# Patient Record
Sex: Female | Born: 1965 | Race: White | Hispanic: No | Smoking: Never smoker
Health system: Southern US, Community
[De-identification: ages and names within clinical notes are randomized; demographics above are authoritative.]

## PROBLEM LIST (undated history)

## (undated) DIAGNOSIS — M1990 Unspecified osteoarthritis, unspecified site: Secondary | ICD-10-CM

## (undated) DIAGNOSIS — T7840XA Allergy, unspecified, initial encounter: Secondary | ICD-10-CM

## (undated) DIAGNOSIS — I10 Essential (primary) hypertension: Secondary | ICD-10-CM

## (undated) HISTORY — DX: Allergy, unspecified, initial encounter: T78.40XA

## (undated) HISTORY — DX: Unspecified osteoarthritis, unspecified site: M19.90

## (undated) HISTORY — DX: Essential (primary) hypertension: I10

---

## 2007-07-26 ENCOUNTER — Ambulatory Visit: Payer: Self-pay | Admitting: General Practice

## 2008-07-31 ENCOUNTER — Ambulatory Visit: Payer: Self-pay | Admitting: General Practice

## 2008-08-08 ENCOUNTER — Ambulatory Visit: Payer: Self-pay | Admitting: General Practice

## 2009-02-04 ENCOUNTER — Ambulatory Visit: Payer: Self-pay | Admitting: General Practice

## 2009-08-06 ENCOUNTER — Ambulatory Visit: Payer: Self-pay | Admitting: General Practice

## 2009-08-15 IMAGING — MG MM ADDITIONAL VIEWS AT NO CHARGE
2 series · 6 of 6 positions shown · non-contrast
Comparison: none

REASON FOR EXAM: Left breast density                           U/S, if
needed
COMMENTS:

PROCEDURE:     MAM - MAM DGTL ADD VW LT  SCR  - August 08, 2008  [DATE]
RESULT:     Additional views of the LEFT breast reveal no definite
underlying abnormality. To assure stability, follow-up LEFT breast mammogram
is suggested in six months.

[L ML · left · 3 of 3 slices shown]
[im 1/3]
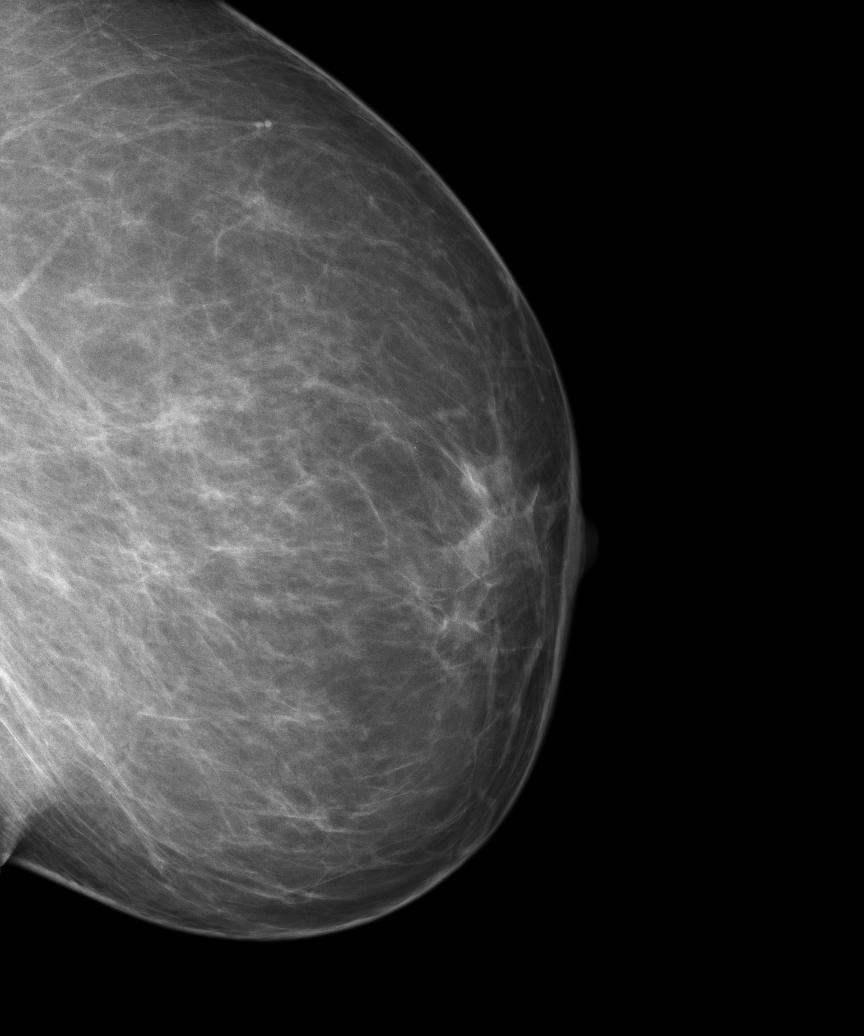
[im 2/3]
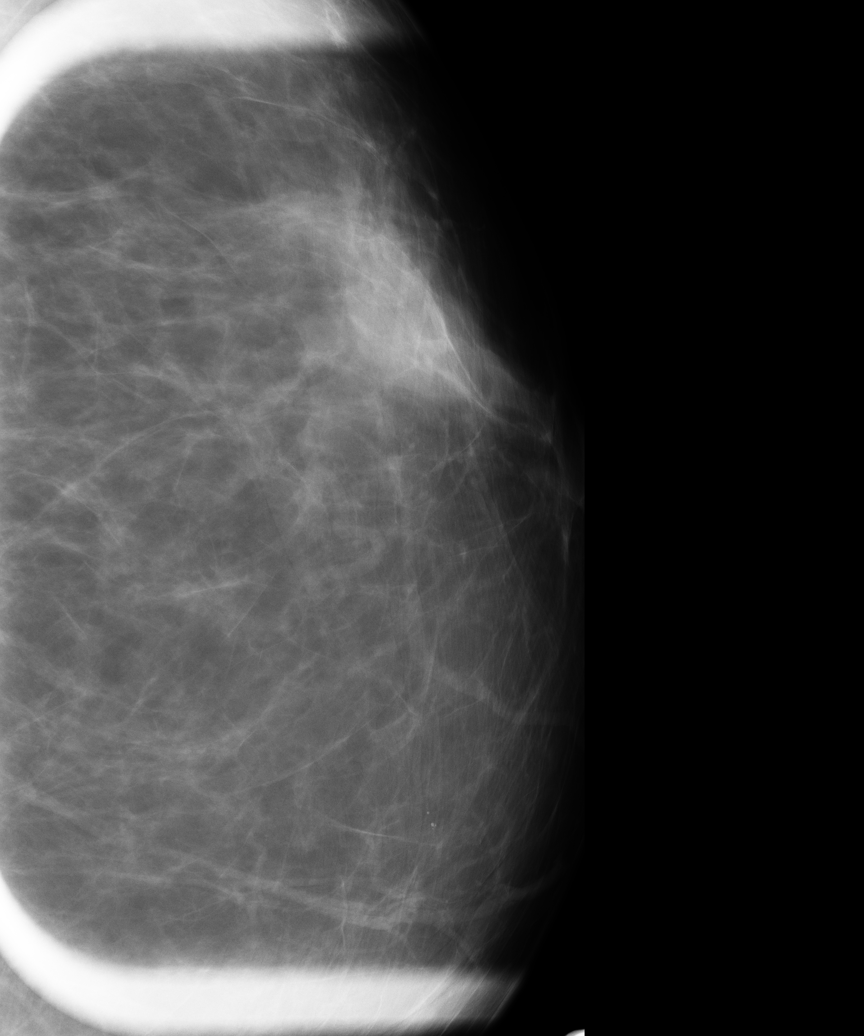
[im 3/3]
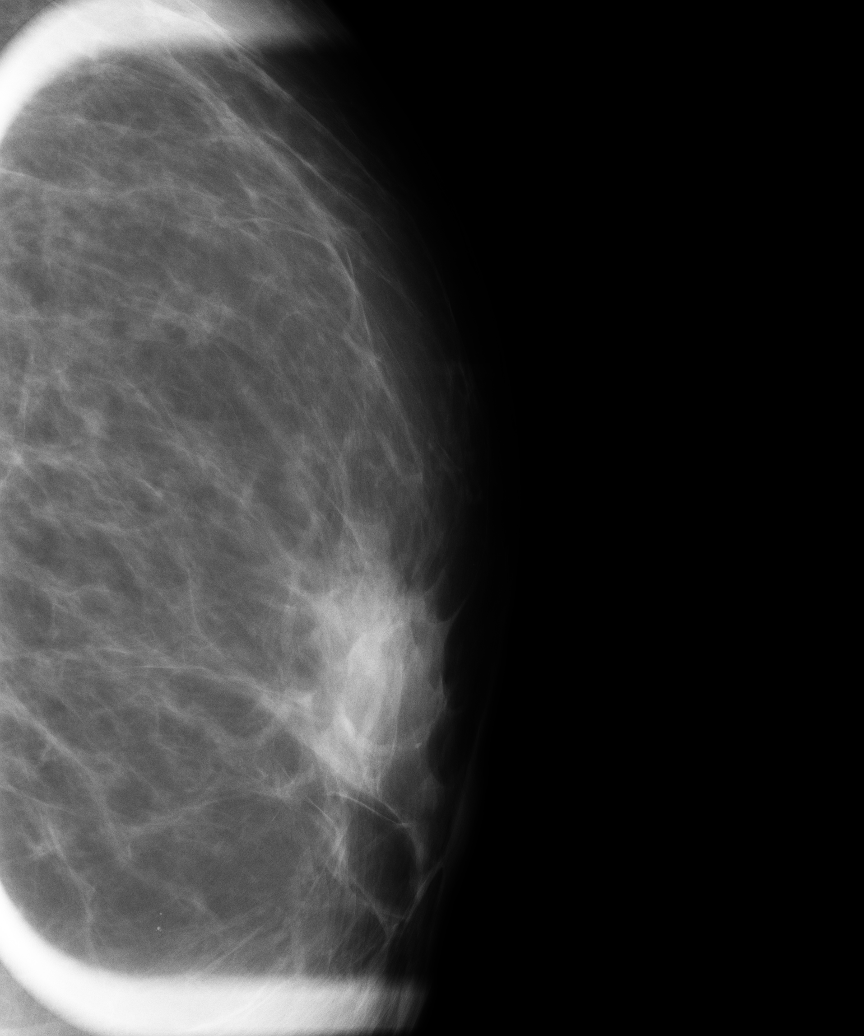

[L XCCL · left · 3 of 3 slices shown]
[im 1/3]
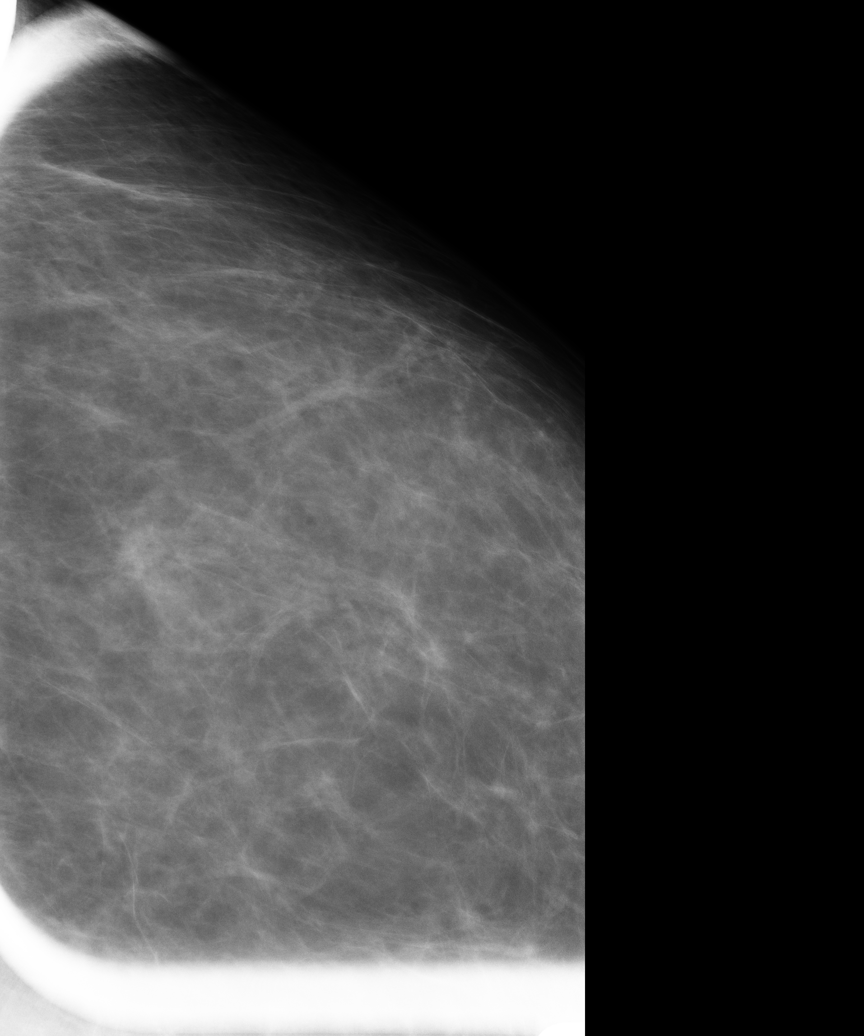
[im 2/3]
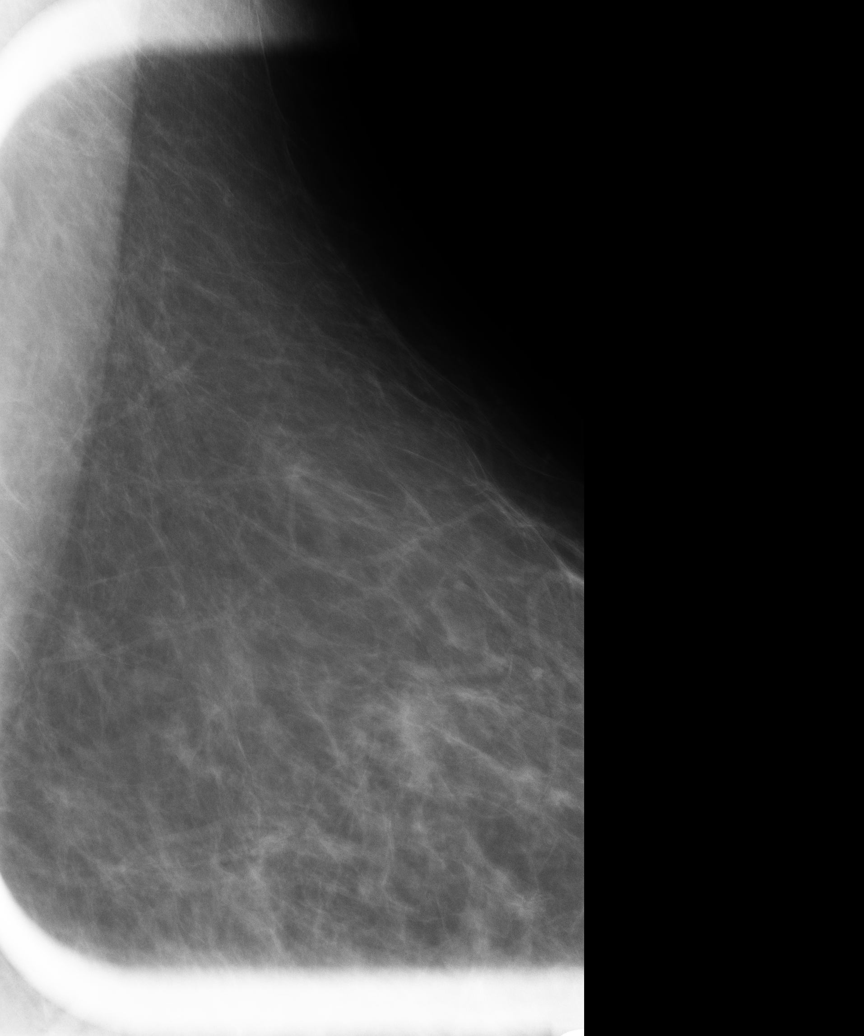
[im 3/3]
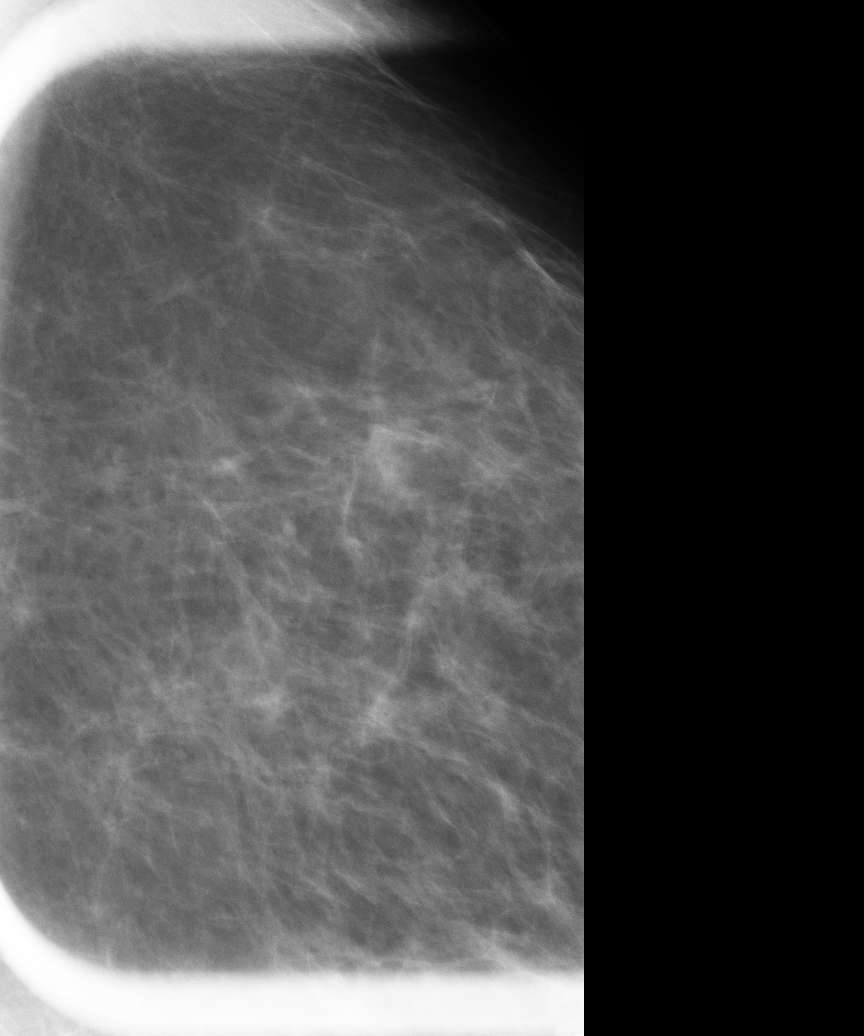

[6 of 6 positions shown; findings below may reference images not displayed]

IMPRESSION: No definite abnormality identified. Follow-up LEFT breast
mammogram is suggested in six months.

BI-RADS: Category 3 - Probably Benign Finding (interval follow-up).

Thank you for this opportunity to contribute to the care of your patient.

A NEGATIVE MAMMOGRAM REPORT DOES NOT PRECLUDE BIOPSY OR OTHER EVALUATION OF
A CLINICALLY PALPABLE OR OTHERWISE SUSPICIOUS MASS OR LESION. BREAST CANCER
MAY NOT BE DETECTED BY MAMMOGRAPHY IN UP TO 10% OF CASES.

## 2010-07-29 ENCOUNTER — Ambulatory Visit: Payer: Self-pay | Admitting: General Practice

## 2011-07-28 ENCOUNTER — Ambulatory Visit: Payer: Self-pay

## 2012-06-17 ENCOUNTER — Ambulatory Visit: Payer: Self-pay

## 2012-07-26 ENCOUNTER — Ambulatory Visit: Payer: Self-pay

## 2013-08-02 ENCOUNTER — Ambulatory Visit: Payer: Self-pay | Admitting: General Practice

## 2014-07-31 ENCOUNTER — Ambulatory Visit: Payer: Self-pay | Admitting: General Practice

## 2015-07-22 ENCOUNTER — Other Ambulatory Visit: Payer: Self-pay | Admitting: Emergency Medicine

## 2015-07-22 DIAGNOSIS — Z1231 Encounter for screening mammogram for malignant neoplasm of breast: Secondary | ICD-10-CM

## 2015-07-24 ENCOUNTER — Ambulatory Visit
Admission: RE | Admit: 2015-07-24 | Discharge: 2015-07-24 | Disposition: A | Discharge status: Home / Self Care | Payer: No Typology Code available for payment source | Source: Ambulatory Visit | Attending: Emergency Medicine | Admitting: Emergency Medicine

## 2015-07-24 DIAGNOSIS — Z1231 Encounter for screening mammogram for malignant neoplasm of breast: Secondary | ICD-10-CM | POA: Insufficient documentation

## 2015-07-30 ENCOUNTER — Other Ambulatory Visit: Payer: Self-pay | Admitting: Family Medicine

## 2015-07-31 LAB — TSH: TSH: 4.56 u[IU]/mL — AB (ref 0.450–4.500)

## 2016-07-13 ENCOUNTER — Other Ambulatory Visit: Payer: Self-pay | Admitting: Family Medicine

## 2016-07-14 LAB — CMP12+LP+TP+TSH+6AC+CBC/D/PLT
A/G RATIO: 1.7 (ref 1.2–2.2)
ALK PHOS: 89 IU/L (ref 39–117)
ALT: 19 IU/L (ref 0–32)
AST: 24 IU/L (ref 0–40)
Albumin: 4.5 g/dL (ref 3.5–5.5)
BUN / CREAT RATIO: 19 (ref 9–23)
BUN: 20 mg/dL (ref 6–24)
Basophils Absolute: 0 10*3/uL (ref 0.0–0.2)
Basos: 0 %
Bilirubin Total: 0.3 mg/dL (ref 0.0–1.2)
CHOL/HDL RATIO: 3.2 ratio (ref 0.0–4.4)
CREATININE: 1.05 mg/dL — AB (ref 0.57–1.00)
Calcium: 9.6 mg/dL (ref 8.7–10.2)
Chloride: 103 mmol/L (ref 96–106)
Cholesterol, Total: 196 mg/dL (ref 100–199)
EOS (ABSOLUTE): 0.1 10*3/uL (ref 0.0–0.4)
EOS: 2 %
Estimated CHD Risk: <0.5 times avg. (ref 0.0–1.0)
Free Thyroxine Index: 2 (ref 1.2–4.9)
GFR, EST AFRICAN AMERICAN: 71 mL/min/{1.73_m2} (ref >59)
GFR, EST NON AFRICAN AMERICAN: 61 mL/min/{1.73_m2} (ref >59)
GGT: 9 IU/L (ref 0–60)
GLUCOSE: 81 mg/dL (ref 65–99)
Globulin, Total: 2.7 g/dL (ref 1.5–4.5)
HDL: 62 mg/dL (ref >39)
HEMATOCRIT: 42.5 % (ref 34.0–46.6)
HEMOGLOBIN: 14.2 g/dL (ref 11.1–15.9)
IMMATURE GRANS (ABS): 0 10*3/uL (ref 0.0–0.1)
Immature Granulocytes: 0 %
Iron: 78 ug/dL (ref 27–159)
LDH: 189 IU/L (ref 119–226)
LDL CALC: 117 mg/dL — AB (ref 0–99)
LYMPHS: 26 %
Lymphocytes Absolute: 1.2 10*3/uL (ref 0.7–3.1)
MCH: 29.4 pg (ref 26.6–33.0)
MCHC: 33.4 g/dL (ref 31.5–35.7)
MCV: 88 fL (ref 79–97)
MONOCYTES: 9 %
Monocytes Absolute: 0.4 10*3/uL (ref 0.1–0.9)
NEUTROS ABS: 3.1 10*3/uL (ref 1.4–7.0)
Neutrophils: 63 %
PHOSPHORUS: 3.9 mg/dL (ref 2.5–4.5)
POTASSIUM: 4.4 mmol/L (ref 3.5–5.2)
Platelets: 207 10*3/uL (ref 150–379)
RBC: 4.83 x10E6/uL (ref 3.77–5.28)
RDW: 14.8 % (ref 12.3–15.4)
Sodium: 143 mmol/L (ref 134–144)
T3 Uptake Ratio: 25 % (ref 24–39)
T4, Total: 7.9 ug/dL (ref 4.5–12.0)
TSH: 4.87 u[IU]/mL — ABNORMAL HIGH (ref 0.450–4.500)
Total Protein: 7.2 g/dL (ref 6.0–8.5)
Triglycerides: 83 mg/dL (ref 0–149)
URIC ACID: 5.7 mg/dL (ref 2.5–7.1)
VLDL CHOLESTEROL CAL: 17 mg/dL (ref 5–40)
WBC: 4.8 10*3/uL (ref 3.4–10.8)

## 2016-07-14 LAB — HGB A1C W/O EAG: HEMOGLOBIN A1C: 5.3 % (ref 4.8–5.6)

## 2016-07-15 ENCOUNTER — Encounter: Payer: Self-pay | Admitting: Internal Medicine

## 2016-07-15 ENCOUNTER — Ambulatory Visit (INDEPENDENT_AMBULATORY_CARE_PROVIDER_SITE_OTHER): Payer: No Typology Code available for payment source | Admitting: Internal Medicine

## 2016-07-15 ENCOUNTER — Other Ambulatory Visit: Payer: Self-pay | Admitting: Internal Medicine

## 2016-07-15 VITALS — BP 116/80 | HR 70 | Temp 98.0°F | Ht 63.75 in | Wt 207.8 lb

## 2016-07-15 DIAGNOSIS — Z1159 Encounter for screening for other viral diseases: Secondary | ICD-10-CM | POA: Diagnosis not present

## 2016-07-15 DIAGNOSIS — Z1231 Encounter for screening mammogram for malignant neoplasm of breast: Secondary | ICD-10-CM

## 2016-07-15 DIAGNOSIS — Z114 Encounter for screening for human immunodeficiency virus [HIV]: Secondary | ICD-10-CM | POA: Diagnosis not present

## 2016-07-15 DIAGNOSIS — Z0000 Encounter for general adult medical examination without abnormal findings: Secondary | ICD-10-CM

## 2016-07-15 DIAGNOSIS — M79671 Pain in right foot: Secondary | ICD-10-CM | POA: Diagnosis not present

## 2016-07-15 NOTE — Progress Notes (Signed)
HPI  Pt presents to th clinic today to establish care. She has not had a PCP in many years. She would like to get her annual exam today if she could.  Flu: never Tetanus: > 10 years ago Pap Smear: never Mammogram: 07/2015, scheduled 07/27/16 Colon Screening: never Vision Screening: as needed Dentist: biannually  Diet: She does eat meat. She consumes fruits and veggies daily. She does eat some fried food. She drinks water and coffee. Exercise: None  Past Medical History:  Diagnosis Date  . Allergy   . Arthritis     No current outpatient prescriptions on file.   No current facility-administered medications for this visit.     Allergies  Allergen Reactions  . Sulfa Antibiotics     Pt does not remember    Family History  Problem Relation Age of Onset  . Breast cancer Paternal Aunt   . Hypertension Mother   . Stroke Mother     Social History   Social History  . Marital status: Divorced    Spouse name: N/A  . Number of children: N/A  . Years of education: N/A   Occupational History  . Not on file.   Social History Main Topics  . Smoking status: Never Smoker  . Smokeless tobacco: Never Used  . Alcohol use Yes     Comment: rare  . Drug use: No  . Sexual activity: Not on file   Other Topics Concern  . Not on file   Social History Narrative  . No narrative on file    ROS:  Constitutional: Denies fever, malaise, fatigue, headache or abrupt weight changes.  HEENT: Denies eye pain, eye redness, ear pain, ringing in the ears, wax buildup, runny nose, nasal congestion, bloody nose, or sore throat. Respiratory: Denies difficulty breathing, shortness of breath, cough or sputum production.   Cardiovascular: Denies chest pain, chest tightness, palpitations or swelling in the hands or feet.  Gastrointestinal: Denies abdominal pain, bloating, constipation, diarrhea or blood in the stool.  GU: Denies frequency, urgency, pain with urination, blood in urine, odor or  discharge. Musculoskeletal: Pt reports right heel pain. Denies decrease in range of motion, difficulty with gait, muscle pain or joint swelling.  Skin: Denies redness, rashes, lesions or ulcercations.  Neurological: Denies dizziness, difficulty with memory, difficulty with speech or problems with balance and coordination.  Psych: Denies anxiety, depression, SI/HI.  No other specific complaints in a complete review of systems (except as listed in HPI above).  PE:  BP 116/80   Pulse 70   Temp 98 F (36.7 C) (Oral)   Ht 5' 3.75" (1.619 m)   Wt 207 lb 12 oz (94.2 kg)   LMP  (LMP Unknown) Comment: postmeopausal  SpO2 98%   BMI 35.94 kg/m  Wt Readings from Last 3 Encounters:  07/15/16 207 lb 12 oz (94.2 kg)    General: Appears her stated age, obese in NAD. HEENT: Head: normal shape and size; Eyes: sclera white, no icterus, conjunctiva pink, PERRLA and EOMs intact; Ears: Tm's gray and intact, normal light reflex;Throat/Mouth: Teeth present, mucosa pink and moist, no lesions or ulcerations noted.  Neck: Neck supple, trachea midline. No masses, lumps or thyromegaly present.  Cardiovascular: Normal rate and rhythm. S1,S2 noted.  No murmur, rubs or gallops noted. No JVD or BLE edema. No carotid bruits noted. Pulmonary/Chest: Normal effort and positive vesicular breath sounds. No respiratory distress. No wheezes, rales or ronchi noted.  Abdomen: Soft and nontender. Normal bowel sounds. No distention  or masses noted. Liver, spleen and kidneys non palpable. Musculoskeletal: Normal dorsi flexion and plantar flexion of right foot. Pain with palpation over the heel. No difficulty with gait.  Neurological: Alert and oriented. Cranial nerves II-XII grossly intact. Coordination normal.  Psychiatric: Mood and affect normal. Behavior is normal. Judgment and thought content normal.    Assessment and Plan:  Preventative Health Maintenance:  She declines flu or tetanus today She declines pap smear  today, but reports she will make an appt to reschedule Mammogram scheduled She declines colonoscopy or Cologuard Encouraged her to see an eye doctor and dentist annually Encouraged her to consume a balanced diet and exercise regimen Will check CBC, CMET, Lipid,TSH, A1C, HIV and Hep C today  Right heel pain:  She reports she has been told she has a bone spur in the past Likely plantar fasciitis Advised her to take Aleve once daily If persist, can refer to podiatry  RTC in 1 year, sooner if you want to have your pap smear done sooner Nicki ReaperBAITY, REGINA, NP

## 2016-07-15 NOTE — Patient Instructions (Signed)

## 2016-07-16 LAB — COMPREHENSIVE METABOLIC PANEL
ALT: 20 U/L (ref 0–35)
AST: 20 U/L (ref 0–37)
Albumin: 3.8 g/dL (ref 3.5–5.2)
Alkaline Phosphatase: 74 U/L (ref 39–117)
BILIRUBIN TOTAL: 0.5 mg/dL (ref 0.2–1.2)
BUN: 19 mg/dL (ref 6–23)
CHLORIDE: 105 meq/L (ref 96–112)
CO2: 28 meq/L (ref 19–32)
Calcium: 9.7 mg/dL (ref 8.4–10.5)
Creatinine, Ser: 0.92 mg/dL (ref 0.40–1.20)
GFR: 68.13 mL/min (ref >60.00)
GLUCOSE: 79 mg/dL (ref 70–99)
POTASSIUM: 4.5 meq/L (ref 3.5–5.1)
Sodium: 139 mEq/L (ref 135–145)
Total Protein: 7.2 g/dL (ref 6.0–8.3)

## 2016-07-16 LAB — CBC
HCT: 41.5 % (ref 36.0–46.0)
HEMOGLOBIN: 14 g/dL (ref 12.0–15.0)
MCHC: 33.7 g/dL (ref 30.0–36.0)
MCV: 87.4 fl (ref 78.0–100.0)
Platelets: 192 10*3/uL (ref 150.0–400.0)
RBC: 4.75 Mil/uL (ref 3.87–5.11)
RDW: 14.2 % (ref 11.5–15.5)
WBC: 6 10*3/uL (ref 4.0–10.5)

## 2016-07-16 LAB — LIPID PANEL
CHOL/HDL RATIO: 3
Cholesterol: 179 mg/dL (ref 0–200)
HDL: 60.4 mg/dL (ref >39.00)
LDL CALC: 101 mg/dL — AB (ref 0–99)
NonHDL: 118.82
TRIGLYCERIDES: 87 mg/dL (ref 0.0–149.0)
VLDL: 17.4 mg/dL (ref 0.0–40.0)

## 2016-07-16 LAB — HEPATITIS C ANTIBODY: HCV AB: NEGATIVE

## 2016-07-16 LAB — HEMOGLOBIN A1C: Hgb A1c MFr Bld: 5.4 % (ref 4.6–6.5)

## 2016-07-16 LAB — TSH: TSH: 4.08 u[IU]/mL (ref 0.35–4.50)

## 2016-07-16 LAB — HIV ANTIBODY (ROUTINE TESTING W REFLEX): HIV 1&2 Ab, 4th Generation: NONREACTIVE

## 2016-07-27 ENCOUNTER — Encounter: Payer: Self-pay | Admitting: Radiology

## 2016-07-27 ENCOUNTER — Ambulatory Visit
Admission: RE | Admit: 2016-07-27 | Discharge: 2016-07-27 | Disposition: A | Discharge status: Home / Self Care | Payer: No Typology Code available for payment source | Source: Ambulatory Visit | Attending: Internal Medicine | Admitting: Internal Medicine

## 2016-07-27 DIAGNOSIS — Z1231 Encounter for screening mammogram for malignant neoplasm of breast: Secondary | ICD-10-CM | POA: Diagnosis present

## 2017-07-06 ENCOUNTER — Ambulatory Visit: Payer: Self-pay | Admitting: *Deleted

## 2017-07-06 VITALS — BP 161/105 | HR 80 | Ht 64.0 in | Wt 219.0 lb

## 2017-07-06 DIAGNOSIS — Z0000 Encounter for general adult medical examination without abnormal findings: Secondary | ICD-10-CM

## 2017-07-06 NOTE — Progress Notes (Signed)
[employer] insurance premium discount evaluation: Labs Drawn. Replacements ROI form signed. Tobacco Free Attestation form signed.  Forms placed in paper chart.  Plan to recheck BP at review.  Okay to route results to pcp per pt.

## 2017-07-07 LAB — CMP12+LP+TP+TSH+6AC+CBC/D/PLT
ALT: 17 IU/L (ref 0–32)
AST: 21 IU/L (ref 0–40)
Albumin/Globulin Ratio: 1.7 (ref 1.2–2.2)
Albumin: 4.3 g/dL (ref 3.5–5.5)
Alkaline Phosphatase: 95 IU/L (ref 39–117)
BASOS ABS: 0 10*3/uL (ref 0.0–0.2)
BUN/Creatinine Ratio: 23 (ref 9–23)
BUN: 22 mg/dL (ref 6–24)
Basos: 1 %
Bilirubin Total: 0.3 mg/dL (ref 0.0–1.2)
CALCIUM: 9.3 mg/dL (ref 8.7–10.2)
CHOL/HDL RATIO: 3 ratio (ref 0.0–4.4)
CREATININE: 0.97 mg/dL (ref 0.57–1.00)
Chloride: 104 mmol/L (ref 96–106)
Cholesterol, Total: 187 mg/dL (ref 100–199)
EOS (ABSOLUTE): 0.1 10*3/uL (ref 0.0–0.4)
Eos: 1 %
Estimated CHD Risk: <0.5 times avg. (ref 0.0–1.0)
Free Thyroxine Index: 1.9 (ref 1.2–4.9)
GFR calc Af Amer: 78 mL/min/{1.73_m2} (ref >59)
GFR, EST NON AFRICAN AMERICAN: 67 mL/min/{1.73_m2} (ref >59)
GGT: 10 IU/L (ref 0–60)
Globulin, Total: 2.6 g/dL (ref 1.5–4.5)
Glucose: 70 mg/dL (ref 65–99)
HDL: 62 mg/dL (ref >39)
Hematocrit: 42.5 % (ref 34.0–46.6)
Hemoglobin: 14 g/dL (ref 11.1–15.9)
IRON: 86 ug/dL (ref 27–159)
Immature Grans (Abs): 0 10*3/uL (ref 0.0–0.1)
Immature Granulocytes: 0 %
LDH: 199 IU/L (ref 119–226)
LDL Calculated: 111 mg/dL — ABNORMAL HIGH (ref 0–99)
Lymphocytes Absolute: 1.1 10*3/uL (ref 0.7–3.1)
Lymphs: 28 %
MCH: 29.4 pg (ref 26.6–33.0)
MCHC: 32.9 g/dL (ref 31.5–35.7)
MCV: 89 fL (ref 79–97)
MONOS ABS: 0.4 10*3/uL (ref 0.1–0.9)
Monocytes: 10 %
Neutrophils Absolute: 2.4 10*3/uL (ref 1.4–7.0)
Neutrophils: 60 %
PHOSPHORUS: 3.1 mg/dL (ref 2.5–4.5)
PLATELETS: 198 10*3/uL (ref 150–379)
POTASSIUM: 4.8 mmol/L (ref 3.5–5.2)
RBC: 4.76 x10E6/uL (ref 3.77–5.28)
RDW: 14.2 % (ref 12.3–15.4)
Sodium: 140 mmol/L (ref 134–144)
T3 UPTAKE RATIO: 25 % (ref 24–39)
T4 TOTAL: 7.7 ug/dL (ref 4.5–12.0)
TOTAL PROTEIN: 6.9 g/dL (ref 6.0–8.5)
TRIGLYCERIDES: 72 mg/dL (ref 0–149)
TSH: 4.22 u[IU]/mL (ref 0.450–4.500)
Uric Acid: 5.4 mg/dL (ref 2.5–7.1)
VLDL Cholesterol Cal: 14 mg/dL (ref 5–40)
WBC: 4 10*3/uL (ref 3.4–10.8)

## 2017-07-07 LAB — HGB A1C W/O EAG: HEMOGLOBIN A1C: 5.3 % (ref 4.8–5.6)

## 2017-07-12 ENCOUNTER — Encounter: Payer: Self-pay | Admitting: Internal Medicine

## 2017-07-12 NOTE — Progress Notes (Signed)
Results reviewed with pt. LDL elevated, slightly worsened from previous. All other labs unremarkable. BP rechecked. 164/105 today. Has f/u scheduled with pcp on 10/16. Asymptomatic with HTN. Diet and exercise recommendations for LDL, wt management (BMI 37) discussed. Routine f/u with pcp. Copy of labs provided to pt. Results routed to pcp per pt request. No further questions concerns.

## 2017-07-20 ENCOUNTER — Encounter: Payer: Self-pay | Admitting: Internal Medicine

## 2017-07-20 ENCOUNTER — Other Ambulatory Visit (HOSPITAL_COMMUNITY)
Admission: RE | Admit: 2017-07-20 | Discharge: 2017-07-20 | Disposition: A | Discharge status: Home / Self Care | Payer: No Typology Code available for payment source | Source: Ambulatory Visit | Attending: Internal Medicine | Admitting: Internal Medicine

## 2017-07-20 ENCOUNTER — Ambulatory Visit (INDEPENDENT_AMBULATORY_CARE_PROVIDER_SITE_OTHER): Payer: No Typology Code available for payment source | Admitting: Internal Medicine

## 2017-07-20 VITALS — BP 122/80 | HR 63 | Temp 97.8°F | Wt 219.0 lb

## 2017-07-20 DIAGNOSIS — Z01419 Encounter for gynecological examination (general) (routine) without abnormal findings: Secondary | ICD-10-CM | POA: Diagnosis not present

## 2017-07-20 DIAGNOSIS — Z23 Encounter for immunization: Secondary | ICD-10-CM | POA: Diagnosis not present

## 2017-07-20 DIAGNOSIS — R030 Elevated blood-pressure reading, without diagnosis of hypertension: Secondary | ICD-10-CM

## 2017-07-20 DIAGNOSIS — Z124 Encounter for screening for malignant neoplasm of cervix: Secondary | ICD-10-CM | POA: Diagnosis not present

## 2017-07-20 NOTE — Addendum Note (Signed)
Addended by: Roena Malady on: 07/20/2017 02:49 PM   Modules accepted: Orders

## 2017-07-20 NOTE — Patient Instructions (Signed)

## 2017-07-20 NOTE — Progress Notes (Signed)
Subjective:    Patient ID: Ellen Fischer, female    DOB: 24-May-1966, 51 y.o.   MRN: [id number]  HPI  Pt presents to the clinic today with c/o elevated blood pressure. 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 Her BP at that visit was 161/105. She has never been told that she has high blood pressure in the past. She denies headaches, visual changes, dizziness or chest pain. Her BP today is 122/80.   Flu: never Tetanus:  >10 years ago Pap Smear: > 5 years ago Mammogram: 07/2016 Colon Screening: never Vision Screening: as needed Dentist: biannually  Review of Systems  Past Medical History:  Diagnosis Date  . Allergy   . Arthritis     No current outpatient prescriptions on file.   No current facility-administered medications for this visit.     Allergies  Allergen Reactions  . Sulfa Antibiotics     Pt does not remember    Family History  Problem Relation Age of Onset  . Breast cancer Paternal Aunt   . Hypertension Mother   . Stroke Mother     Social History   Social History  . Marital status: Divorced    Spouse name: N/A  . Number of children: N/A  . Years of education: N/A   Occupational History  . Not on file.   Social History Main Topics  . Smoking status: Never Smoker  . Smokeless tobacco: Never Used  . Alcohol use Yes     Comment: rare  . Drug use: No  . Sexual activity: No   Other Topics Concern  . Not on file   Social History Narrative  . No narrative on file     Constitutional: Denies fever, malaise, fatigue, headache or abrupt weight changes.  Respiratory: Denies difficulty breathing, shortness of breath, cough or sputum production.   Cardiovascular: Denies chest pain, chest tightness, palpitations or swelling in the hands or feet.  Gastrointestinal: Denies abdominal pain, bloating, constipation, diarrhea or blood in the stool.  GU: Denies urgency, frequency, pain with urination, burning sensation, blood  in urine, odor or discharge. Neurological: Denies dizziness, difficulty with memory, difficulty with speech or problems with balance and coordination.    No other specific complaints in a complete review of systems (except as listed in HPI above).     Objective:   Physical Exam  BP 122/80   Pulse 63   Temp 97.8 F (36.6 C) (Oral)   Wt 219 lb (99.3 kg)   LMP  (LMP Unknown) Comment: postmeopausal  SpO2 98%   BMI 37.59 kg/m  Wt Readings from Last 3 Encounters:  07/20/17 219 lb (99.3 kg)  07/06/17 219 lb (99.3 kg)  07/15/16 207 lb 12 oz (94.2 kg)    General: Appears her stated age, obese in NAD. SCardiovascular: Normal rate and rhythm. S1,S2 noted.  No murmur, rubs or gallops noted.  Pulmonary/Chest: Normal effort and positive vesicular breath sounds. No respiratory distress. No wheezes, rales or ronchi noted.  Abdomen: Soft and nontender. Normal bowel sounds. No distention or masses noted. Pelvic: Normal female anatomy. Cervix without changes. No discharge noted. Adnexa non palpable. Neurological: Alert and oriented.   BMET    Component Value Date/Time   NA 140 07/06/2017 0922   K 4.8 07/06/2017 0922   CL 104 07/06/2017 0922   CO2 28 07/15/2016 1552   GLUCOSE 70 07/06/2017 0922   GLUCOSE 79 07/15/2016 1552   BUN 22 07/06/2017 4098  CREATININE 0.97 07/06/2017 0922   CALCIUM 9.3 07/06/2017 0922   GFRNONAA 67 07/06/2017 0922   GFRAA 78 07/06/2017 0922    Lipid Panel     Component Value Date/Time   CHOL 187 07/06/2017 0922   TRIG 72 07/06/2017 0922   HDL 62 07/06/2017 0922   CHOLHDL 3.0 07/06/2017 0922   CHOLHDL 3 07/15/2016 1552   VLDL 17.4 07/15/2016 1552   LDLCALC 111 (H) 07/06/2017 0922    CBC    Component Value Date/Time   WBC 4.0 07/06/2017 0922   WBC 6.0 07/15/2016 1552   RBC 4.76 07/06/2017 0922   RBC 4.75 07/15/2016 1552   HGB 14.0 07/06/2017 0922   HCT 42.5 07/06/2017 0922   PLT 198 07/06/2017 0922   MCV 89 07/06/2017 0922   MCH 29.4  07/06/2017 0922   MCHC 32.9 07/06/2017 0922   MCHC 33.7 07/15/2016 1552   RDW 14.2 07/06/2017 0922   LYMPHSABS 1.1 07/06/2017 0922   EOSABS 0.1 07/06/2017 0922   BASOSABS 0.0 07/06/2017 0922    Hgb A1C Lab Results  Component Value Date   HGBA1C 5.3 07/06/2017            Assessment & Plan:   Screening for Cervical Cancer with Routine GYN Exam:  Pap smear obtained today, will call you with the results  Need for Tetanus:  Tetanus booster today  Elevated Blood Pressure Reading without diagnosis of HTN:  BP is fine No need for medication therapy Will monitor  RTC in 1 year for your annual exam Nicki Reaper, NP

## 2017-07-22 LAB — CYTOLOGY - PAP
DIAGNOSIS: NEGATIVE
HPV: NOT DETECTED

## 2017-08-02 ENCOUNTER — Other Ambulatory Visit: Payer: Self-pay | Admitting: Internal Medicine

## 2017-08-02 DIAGNOSIS — Z1231 Encounter for screening mammogram for malignant neoplasm of breast: Secondary | ICD-10-CM

## 2017-08-05 ENCOUNTER — Ambulatory Visit
Admission: RE | Admit: 2017-08-05 | Discharge: 2017-08-05 | Disposition: A | Discharge status: Home / Self Care | Payer: No Typology Code available for payment source | Source: Ambulatory Visit | Attending: Internal Medicine | Admitting: Internal Medicine

## 2017-08-05 DIAGNOSIS — Z1231 Encounter for screening mammogram for malignant neoplasm of breast: Secondary | ICD-10-CM | POA: Insufficient documentation

## 2018-03-01 ENCOUNTER — Ambulatory Visit: Payer: Self-pay | Admitting: Registered Nurse

## 2018-03-01 ENCOUNTER — Encounter: Payer: Self-pay | Admitting: Registered Nurse

## 2018-03-01 VITALS — BP 130/101 | HR 94 | Temp 99.8°F | Resp 16

## 2018-03-01 DIAGNOSIS — H65192 Other acute nonsuppurative otitis media, left ear: Secondary | ICD-10-CM

## 2018-03-01 DIAGNOSIS — J029 Acute pharyngitis, unspecified: Secondary | ICD-10-CM

## 2018-03-01 DIAGNOSIS — J019 Acute sinusitis, unspecified: Secondary | ICD-10-CM

## 2018-03-01 MED ORDER — SALINE SPRAY 0.65 % NA SOLN: 2.0000 | NASAL | 0 refills | Status: DC

## 2018-03-01 MED ORDER — FLUTICASONE PROPIONATE 50 MCG/ACT NA SUSP: 1.0000 | Freq: Every day | NASAL | 1 refills | Status: DC

## 2018-03-01 MED ORDER — BENZONATATE 200 MG PO CAPS: 200.0000 mg | ORAL_CAPSULE | Freq: Three times a day (TID) | ORAL | 0 refills | Status: AC | PRN

## 2018-03-01 MED ORDER — ACETAMINOPHEN 500 MG PO TABS: 1000.0000 mg | ORAL_TABLET | Freq: Four times a day (QID) | ORAL | 0 refills | Status: AC | PRN

## 2018-03-01 MED ORDER — AMOXICILLIN 875 MG PO TABS: 875.0000 mg | ORAL_TABLET | Freq: Two times a day (BID) | ORAL | 0 refills | Status: AC

## 2018-03-01 NOTE — Progress Notes (Signed)
Subjective:    Patient ID: Ellen Fischer, female    DOB: 11-Feb-1966, 52 y.o.   MRN: [id number]  52 y/o Caucasian female pt c/o productive cough, sore throat x1 wk, worse in past 4 days. Chest very sore from the cough. C/o pain below bil ears down neck, possible eustachian tube discomfort. States she feels like her tongue and throat are swollen from coughing so much. Pt in NAD, 97% O2 sat, slightly hoarse voice, speaking complete sentences with occasional interruption by need to cough. Denies sinus pain/pressure, HA, known fever at home, rhinorrhea. Has been taking Alka Seltzer Plus Severe Cold & Cough with some relief at home. Last dose 9pm last night. No other meds, Rx . Has also tried dayquil and nyquil that worked a little but didn't resolve symptoms.  Denied sick contacts at home.  +sick contacts at work.  Typically seasonal allergies worst in the fall  Has noticed some allergy symptoms this spring.       Review of Systems  Constitutional: Positive for fever. Negative for activity change, appetite change, chills, diaphoresis and fatigue.  HENT: Positive for congestion, mouth sores, postnasal drip, rhinorrhea, sore throat and voice change. Negative for dental problem, drooling, ear discharge, ear pain, facial swelling, hearing loss, nosebleeds, sinus pressure, sinus pain, sneezing, tinnitus and trouble swallowing.   Eyes: Negative for photophobia and visual disturbance.  Respiratory: Positive for cough and wheezing. Negative for choking, chest tightness, shortness of breath and stridor.   Cardiovascular: Negative for palpitations.  Gastrointestinal: Negative for abdominal distention, abdominal pain, diarrhea, nausea and vomiting.  Endocrine: Negative for cold intolerance and heat intolerance.  Genitourinary: Negative for difficulty urinating.  Musculoskeletal: Negative for arthralgias, back pain, gait problem, joint swelling, myalgias, neck pain and neck stiffness.  Skin: Negative  for color change, pallor and rash.  Allergic/Immunologic: Positive for environmental allergies. Negative for food allergies.  Neurological: Negative for dizziness, tremors, seizures, syncope, facial asymmetry, speech difficulty, weakness, light-headedness, numbness and headaches.  Hematological: Negative for adenopathy. Does not bruise/bleed easily.  Psychiatric/Behavioral: Negative for agitation, confusion and sleep disturbance.       Objective:   Physical Exam  hysical Exam  Constitutional: She is oriented to person, place, and time. She appears well-developed and well-nourished. She is active and cooperative.  Non-toxic appearance. She does not have a sickly appearance. She appears ill. No distress.  HENT:  Head: Normocephalic and atraumatic.  Right Ear: Hearing, external ear and ear canal normal. No drainage, swelling or tenderness. A middle ear effusion is present. No decreased hearing is noted.  Left Ear: Hearing, external ear and ear canal normal. No drainage, swelling or tenderness. Tympanic membrane is erythematous and bulging. A middle ear effusion is present. No decreased hearing is noted.  Nose: Mucosal edema and rhinorrhea present. No nose lacerations, sinus tenderness, nasal deformity, septal deviation or nasal septal hematoma. No epistaxis.  No foreign bodies. Right sinus exhibits no maxillary sinus tenderness and no frontal sinus tenderness. Left sinus exhibits no maxillary sinus tenderness and no frontal sinus tenderness.  Mouth/Throat: Uvula is midline and mucous membranes are normal. Mucous membranes are not pale, not dry and not cyanotic. She does not have dentures. Oral lesions present. No trismus in the jaw. Normal dentition. No dental abscesses, uvula swelling, lacerations or dental caries. Posterior oropharyngeal edema and posterior oropharyngeal erythema present. No oropharyngeal exudate or tonsillar abscesses. Tonsils are 2+ on the right. Tonsils are 2+ on the left. No  tonsillar exudate.  Bilateral tonsils edema  erythema macular 2+/4; cobblestoning posterior pharynx; oropharynx macular papular rash erythema; bilateral allergic shiners; bilateral nasal turbinates edema erythema clear discharge; left TM central erythema air fluid level clear; right TM air fluid level clear  Eyes: Pupils are equal, round, and reactive to light. Conjunctivae, EOM and lids are normal. Right eye exhibits no chemosis, no discharge, no exudate and no hordeolum. No foreign body present in the right eye. Left eye exhibits no chemosis, no discharge, no exudate and no hordeolum. No foreign body present in the left eye. Right conjunctiva is not injected. Right conjunctiva has no hemorrhage. Left conjunctiva is not injected. Left conjunctiva has no hemorrhage. No scleral icterus. Right eye exhibits normal extraocular motion and no nystagmus. Left eye exhibits normal extraocular motion and no nystagmus. Right pupil is round and reactive. Left pupil is round and reactive. Pupils are equal.  Neck: Trachea normal, normal range of motion and phonation normal. Neck supple. No tracheal tenderness and no muscular tenderness present. No neck rigidity. No tracheal deviation, no edema, no erythema and normal range of motion present. No thyroid mass and no thyromegaly present.  Cardiovascular: Normal rate, regular rhythm, S1 normal, S2 normal, normal heart sounds and intact distal pulses. PMI is not displaced. Exam reveals no gallop, no distant heart sounds and no friction rub.  No murmur heard. Pulmonary/Chest: Effort normal and breath sounds normal. No accessory muscle usage or stridor. No respiratory distress. She has no decreased breath sounds. She has no wheezes. She has no rhonchi. She has no rales. She exhibits no tenderness.  Hoarse voice; spoke full sentences without difficulty; intermittent dry weak cough  Abdominal: Soft. Normal appearance. She exhibits no distension and no fluid wave. There is no  rigidity and no guarding.  Musculoskeletal: Normal range of motion. She exhibits no edema or tenderness.       Right shoulder: Normal.       Left shoulder: Normal.       Right elbow: Normal.      Left elbow: Normal.       Right hip: Normal.       Left hip: Normal.       Right knee: Normal.       Left knee: Normal.       Cervical back: Normal.       Thoracic back: Normal.       Lumbar back: Normal.       Right hand: Normal.       Left hand: Normal.  Lymphadenopathy:       Head (right side): No submental, no submandibular, no tonsillar, no preauricular, no posterior auricular and no occipital adenopathy present.       Head (left side): No submental, no submandibular, no tonsillar, no preauricular, no posterior auricular and no occipital adenopathy present.    She has no cervical adenopathy.       Right cervical: No superficial cervical, no deep cervical and no posterior cervical adenopathy present.      Left cervical: No superficial cervical, no deep cervical and no posterior cervical adenopathy present.  Anterior cervical proximal lymph nodes ttp but no nodules/enlargement palpated  Neurological: She is alert and oriented to person, place, and time. She has normal strength. She is not disoriented. She displays no atrophy and no tremor. No cranial nerve deficit or sensory deficit. She exhibits normal muscle tone. She displays no seizure activity. Coordination and gait normal. GCS eye subscore is 4. GCS verbal subscore is 5. GCS motor subscore is 6.  On/off exam table; in/out of chair without difficulty; gait sure and steady in hallway  Skin: Skin is warm, dry and intact. Capillary refill takes less than 2 seconds. No abrasion, no bruising, no burn, no ecchymosis, no laceration, no lesion, no petechiae and no rash noted. She is not diaphoretic. No cyanosis or erythema. No pallor. Nails show no clubbing.  Psychiatric: She has a normal mood and affect. Her speech is normal and behavior is normal.  Judgment and thought content normal. She is not actively hallucinating. Cognition and memory are normal. She is attentive.  Nursing note and vitals reviewed.            Assessment & Plan:   A-viral URI with cough; left otitis media nonsupportive acute nonrecurrent; acute rhinosinusitis; acute pharyngitis, elevated blood pressure  P-Supportive treatment. Tylenol  po QI Dprn pain 8 UD given to patient from clinic stock.  Amoxicillin  po BID x 10 days #20 RF0 dispensed from [facility]  No evidence of invasive bacterial infection, non toxic and well hydrated.  This is most likely self limiting viral infection.  I do not see where any further testing or imaging is necessary at this time.   I will suggest supportive care, rest, good hygiene and encourage the patient to take adequate fluids.  The patient is to return to clinic or EMERGENCY ROOM if symptoms worsen or change significantly e.g. ear pain, fever, purulent discharge from ears or bleeding.  Exitcare handout on otitis media given to patient.  Patient verbalized agreement and understanding of treatment plan and had no further questions at this time.  Patient may use normal saline nasal spray 2 sprays each nostril q2h wa as needed given 1 bottle from clinic stock. flonase 1 spray each nostril BID #1 RF1 and tessalon pearles  po TID prn cough #30 RF0  electronic Rx to her pharmacy of choice.  Patient has cough lozenges at her desk to use prn q2h po.  Hydrate to keep urine pale clear yellow tinged avoid dehydration.  Patient denied personal or family history of ENT cancer.  OTC antihistamine of choice claritin/zyrtec  po daily.  Avoid triggers if possible.  Shower prior to bedtime if exposed to triggers.  If allergic dust/dust mites recommend mattress/pillow covers/encasements; washing linens, vacuuming, sweeping, dusting weekly.  Call or return to clinic as needed if these symptoms worsen or fail to improve as anticipated.    Exitcare handout on viral uri, allergic rhinitis and sinus rinse given to patient.  Patient verbalized understanding of instructions, agreed with plan of care and had no further questions at this time.   Tylenol  po QID prn pain/fever.  Discussed amoxicillin for otitis media also covers for strep throat but I believe this sore throat post nasal drip related.  Dry up drip to get sore throat resolved.  Elevated blood pressure probably from OTC cough and cold medicine use and acute illness/pain.  Exitcare handout on acute pharyngitis.   If dyspnea/dysphagia patient to go to ER for re-evaluation discussed signs and symptoms of tonsillar abscess with patient. Usually no specific medical treatment is needed if a virus is causing the sore throat. The throat most often gets better on its own within 5 to 7 days. Antibiotic medicine does not cure viral pharyngitis.  For acute pharyngitis caused by bacteria, your healthcare provider will prescribe an antibiotic.  Marland Kitchen Do not smoke.  Marland Kitchen Avoid secondhand smoke and other air pollutants.  . Use a cool mist humidifier to add moisture  to the air.  . Get plenty of rest.  . You may want to rest your throat by talking less and eating a diet that is mostly liquid or soft for a day or two.  Marland Kitchen Nonprescription throat lozenges and mouthwashes should help relieve the soreness.  . Gargling with warm saltwater and drinking warm liquids may help. (You can make a saltwater solution by adding 1/4 teaspoon of salt to 8 ounces, or 240 mL, of warm water.)  . A nonprescription pain reliever such as aspirin, acetaminophen, or ibuprofen may ease general aches and pains.  FOLLOW UP with clinic provider if no improvements in the next 7-10 days. Patient verbalized understanding of instructions and agreed with plan of care.  P2: Hand washing and diet.  P2:  Avoidance and hand washing.    See RN Rolly Salter for repeat blood pressure when off OTC medication and feeling better.  ER if chest pain,  dyspnea, worst headache of life  Reviewed chart and BP high normal when she had pap in past year with PCM.  High at [facility] with RN Rolly Salter last fall.

## 2018-03-01 NOTE — Patient Instructions (Signed)
Viral Respiratory Infection A respiratory infection is an illness that affects part of the respiratory system, such as the lungs, nose, or throat. Most respiratory infections are caused by either viruses or bacteria. A respiratory infection that is caused by a virus is called a viral respiratory infection. Common types of viral respiratory infections include:  A cold.  The flu (influenza).  A respiratory syncytial virus (RSV) infection.  How do I know if I have a viral respiratory infection? Most viral respiratory infections cause:  A stuffy or runny nose.  Yellow or green nasal discharge.  A cough.  Sneezing.  Fatigue.  Achy muscles.  A sore throat.  Sweating or chills.  A fever.  A headache.  How are viral respiratory infections treated? If influenza is diagnosed early, it may be treated with an antiviral medicine that shortens the length of time a person has symptoms. Symptoms of viral respiratory infections may be treated with over-the-counter and prescription medicines, such as:  Expectorants. These make it easier to cough up mucus.  Decongestant nasal sprays.  Health care providers do not prescribe antibiotic medicines for viral infections. This is because antibiotics are designed to kill bacteria. They have no effect on viruses. How do I know if I should stay home from work or school? To avoid exposing others to your respiratory infection, stay home if you have:  A fever.  A persistent cough.  A sore throat.  A runny nose.  Sneezing.  Muscles aches.  Headaches.  Fatigue.  Weakness.  Chills.  Sweating.  Nausea.  Follow these instructions at home:  Rest as much as possible.  Take over-the-counter and prescription medicines only as told by your health care provider.  Drink enough fluid to keep your urine clear or pale yellow. This helps prevent dehydration and helps loosen up mucus.  Gargle with a salt-water mixture 3-4 times per day or  as needed. To make a salt-water mixture, completely dissolve -1 tsp of salt in 1 cup of warm water.  Use nose drops made from salt water to ease congestion and soften raw skin around your nose.  Do not drink alcohol.  Do not use tobacco products, including cigarettes, chewing tobacco, and e-cigarettes. If you need help quitting, ask your health care provider. Contact a health care provider if:  Your symptoms last for 10 days or longer.  Your symptoms get worse over time.  You have a fever.  You have severe sinus pain in your face or forehead.  The glands in your jaw or neck become very swollen. Get help right away if:  You feel pain or pressure in your chest.  You have shortness of breath.  You faint or feel like you will faint.  You have severe and persistent vomiting.  You feel confused or disoriented. This information is not intended to replace advice given to you by your health care provider. Make sure you discuss any questions you have with your health care provider. Document Released: 07/01/2005 Document Revised: 02/27/2016 Document Reviewed: 02/27/2015 Elsevier Interactive Patient Education  2018 [employer] Pharyngitis Pharyngitis is redness, pain, and swelling (inflammation) of the throat (pharynx). It is a very common cause of sore throat. Pharyngitis can be caused by a bacteria, but it is usually caused by a virus. Most cases of pharyngitis get better on their own without treatment. What are the causes? This condition may be caused by:  Infection by viruses (viral). Viral pharyngitis spreads from person to person (is contagious) through coughing, sneezing,  and sharing of personal items or utensils such as cups, forks, spoons, and toothbrushes.  Infection by bacteria (bacterial). Bacterial pharyngitis may be spread by touching the nose or face after coming in contact with the bacteria, or through more intimate contact, such as kissing.  Allergies. Allergies can  cause buildup of mucus in the throat (post-nasal drip), leading to inflammation and irritation. Allergies can also cause blocked nasal passages, forcing breathing through the mouth, which dries and irritates the throat.  What increases the risk? You are more likely to develop this condition if:  You are 79-57 years old.  You are exposed to crowded environments such as daycare, school, or dormitory living.  You live in a cold climate.  You have a weakened disease-fighting (immune) system.  What are the signs or symptoms? Symptoms of this condition vary by the cause (viral, bacterial, or allergies) and can include:  Sore throat.  Fatigue.  Low-grade fever.  Headache.  Joint pain and muscle aches.  Skin rashes.  Swollen glands in the throat (lymph nodes).  Plaque-like film on the throat or tonsils. This is often a symptom of bacterial pharyngitis.  Vomiting.  Stuffy nose (nasal congestion).  Cough.  Red, itchy eyes (conjunctivitis).  Loss of appetite.  How is this diagnosed? This condition is often diagnosed based on your medical history and a physical exam. Your health care provider will ask you questions about your illness and your symptoms. A swab of your throat may be done to check for bacteria (rapid strep test). Other lab tests may also be done, depending on the suspected cause, but these are rare. How is this treated? This condition usually gets better in 3-4 days without medicine. Bacterial pharyngitis may be treated with antibiotic medicines. Follow these instructions at home:  Take over-the-counter and prescription medicines only as told by your health care provider. ? If you were prescribed an antibiotic medicine, take it as told by your health care provider. Do not stop taking the antibiotic even if you start to feel better. ? Do not give children aspirin because of the association with Reye syndrome.  Drink enough water and fluids to keep your urine clear  or pale yellow.  Get a lot of rest.  Gargle with a salt-water mixture 3-4 times a day or as needed. To make a salt-water mixture, completely dissolve -1 tsp of salt in 1 cup of warm water.  If your health care provider approves, you may use throat lozenges or sprays to soothe your throat. Contact a health care provider if:  You have large, tender lumps in your neck.  You have a rash.  You cough up green, yellow-brown, or bloody spit. Get help right away if:  Your neck becomes stiff.  You drool or are unable to swallow liquids.  You cannot drink or take medicines without vomiting.  You have severe pain that does not go away, even after you take medicine.  You have trouble breathing, and it is not caused by a stuffy nose.  You have new pain and swelling in your joints such as the knees, ankles, wrists, or elbows. Summary  Pharyngitis is redness, pain, and swelling (inflammation) of the throat (pharynx).  While pharyngitis can be caused by a bacteria, the most common causes are viral.  Most cases of pharyngitis get better on their own without treatment.  Bacterial pharyngitis is treated with antibiotic medicines. This information is not intended to replace advice given to you by your health care provider. Make  sure you discuss any questions you have with your health care provider. Document Released: 09/21/2005 Document Revised: 10/27/2016 Document Reviewed: 10/27/2016 Elsevier Interactive Patient Education  2018 [employer]. Otitis Media, Adult Otitis media occurs when there is inflammation and fluid in the middle ear. Your middle ear is a part of the ear that contains bones for hearing as well as air that helps send sounds to your brain. What are the causes? This condition is caused by a blockage in the eustachian tube. This tube drains fluid from the ear to the back of the nose (nasopharynx). A blockage in this tube can be caused by an object or by swelling (edema) in the  tube. Problems that can cause a blockage include:  A cold or other upper respiratory infection.  Allergies.  An irritant, such as tobacco smoke.  Enlarged adenoids. The adenoids are areas of soft tissue located high in the back of the throat, behind the nose and the roof of the mouth.  A mass in the nasopharynx.  Damage to the ear caused by pressure changes (barotrauma).  What are the signs or symptoms? Symptoms of this condition include:  Ear pain.  A fever.  Decreased hearing.  A headache.  Tiredness (lethargy).  Fluid leaking from the ear.  Ringing in the ear.  How is this diagnosed? This condition is diagnosed with a physical exam. During the exam your health care provider will use an instrument called an otoscope to look into your ear and check for redness, swelling, and fluid. He or she will also ask about your symptoms. Your health care provider may also order tests, such as:  A test to check the movement of the eardrum (pneumatic otoscopy). This test is done by squeezing a small amount of air into the ear.  A test that changes air pressure in the middle ear to check how well the eardrum moves and whether the eustachian tube is working (tympanogram).  How is this treated? This condition usually goes away on its own within 3-5 days. But if the condition is caused by a bacteria infection and does not go away own its own, or keeps coming back, your health care provider may:  Prescribe antibiotic medicines to treat the infection.  Prescribe or recommend medicines to control pain.  Follow these instructions at home:  Take over-the-counter and prescription medicines only as told by your health care provider.  If you were prescribed an antibiotic medicine, take it as told by your health care provider. Do not stop taking the antibiotic even if you start to feel better.  Keep all follow-up visits as told by your health care provider. This is important. Contact a  health care provider if:  You have bleeding from your nose.  There is a lump on your neck.  You are not getting better in 5 days.  You feel worse instead of better. Get help right away if:  You have severe pain that is not controlled with medicine.  You have swelling, redness, or pain around your ear.  You have stiffness in your neck.  A part of your face is paralyzed.  The bone behind your ear (mastoid) is tender when you touch it.  You develop a severe headache. Summary  Otitis media is redness, soreness, and swelling of the middle ear.  This condition usually goes away on its own within 3-5 days.  If the problem does not go away in 3-5 days, your health care provider may prescribe or recommend medicines  to treat your symptoms.  If you were prescribed an antibiotic medicine, take it as told by your health care provider. This information is not intended to replace advice given to you by your health care provider. Make sure you discuss any questions you have with your health care provider. Document Released: 06/26/2004 Document Revised: 09/11/2016 Document Reviewed: 09/11/2016 Elsevier Interactive Patient Education  2018 [employer]. Allergic Rhinitis, Adult Allergic rhinitis is an allergic reaction that affects the mucous membrane inside the nose. It causes sneezing, a runny or stuffy nose, and the feeling of mucus going down the back of the throat (postnasal drip). Allergic rhinitis can be mild to severe. There are two types of allergic rhinitis:  Seasonal. This type is also called hay fever. It happens only during certain seasons.  Perennial. This type can happen at any time of the year.  What are the causes? This condition happens when the body's defense system (immune system) responds to certain harmless substances called allergens as though they were germs.  Seasonal allergic rhinitis is triggered by pollen, which can come from grasses, trees, and weeds. Perennial  allergic rhinitis may be caused by:  House dust mites.  Pet dander.  Mold spores.  What are the signs or symptoms? Symptoms of this condition include:  Sneezing.  Runny or stuffy nose (nasal congestion).  Postnasal drip.  Itchy nose.  Tearing of the eyes.  Trouble sleeping.  Daytime sleepiness.  How is this diagnosed? This condition may be diagnosed based on:  Your medical history.  A physical exam.  Tests to check for related conditions, such as: ? Asthma. ? Pink eye. ? Ear infection. ? Upper respiratory infection.  Tests to find out which allergens trigger your symptoms. These may include skin or blood tests.  How is this treated? There is no cure for this condition, but treatment can help control symptoms. Treatment may include:  Taking medicines that block allergy symptoms, such as antihistamines. Medicine may be given as a shot, nasal spray, or pill.  Avoiding the allergen.  Desensitization. This treatment involves getting ongoing shots until your body becomes less sensitive to the allergen. This treatment may be done if other treatments do not help.  If taking medicine and avoiding the allergen does not work, new, stronger medicines may be prescribed.  Follow these instructions at home:  Find out what you are allergic to. Common allergens include smoke, dust, and pollen.  Avoid the things you are allergic to. These are some things you can do to help avoid allergens: ? Replace carpet with wood, tile, or vinyl flooring. Carpet can trap dander and dust. ? Do not smoke. Do not allow smoking in your home. ? Change your heating and air conditioning filter at least once a month. ? During allergy season:  Keep windows closed as much as possible.  Plan outdoor activities when pollen counts are lowest. This is usually during the evening hours.  When coming indoors, change clothing and shower before sitting on furniture or bedding.  Take over-the-counter  and prescription medicines only as told by your health care provider.  Keep all follow-up visits as told by your health care provider. This is important. Contact a health care provider if:  You have a fever.  You develop a persistent cough.  You make whistling sounds when you breathe (you wheeze).  Your symptoms interfere with your normal daily activities. Get help right away if:  You have shortness of breath. Summary  This condition can be managed  by taking medicines as directed and avoiding allergens.  Contact your health care provider if you develop a persistent cough or fever.  During allergy season, keep windows closed as much as possible. This information is not intended to replace advice given to you by your health care provider. Make sure you discuss any questions you have with your health care provider. Document Released: 06/16/2001 Document Revised: 10/29/2016 Document Reviewed: 10/29/2016 Elsevier Interactive Patient Education  2018 [employer]. Sinus Rinse What is a sinus rinse? A sinus rinse is a simple home treatment that is used to rinse your sinuses with a sterile mixture of salt and water (saline solution). Sinuses are air-filled spaces in your skull behind the bones of your face and forehead that open into your nasal cavity. You will use the following:  Saline solution.  Neti pot or spray bottle. This releases the saline solution into your nose and through your sinuses. Neti pots and spray bottles can be purchased at [employer], a health food store, or online.  When would I do a sinus rinse? A sinus rinse can help to clear mucus, dirt, dust, or pollen from the nasal cavity. You may do a sinus rinse when you have a cold, a virus, nasal allergy symptoms, a sinus infection, or stuffiness in the nose or sinuses. If you are considering a sinus rinse:  Ask your child's health care provider before performing a sinus rinse on your child.  Do not do a sinus  rinse if you have had ear or nasal surgery, ear infection, or blocked ears.  How do I do a sinus rinse?  Wash your hands.  Disinfect your device according to the directions provided and then dry it.  Use the solution that comes with your device or one that is sold separately in stores. Follow the mixing directions on the package.  Fill your device with the amount of saline solution as directed by the device instructions.  Stand over a sink and tilt your head sideways over the sink.  Place the spout of the device in your upper nostril (the one closer to the ceiling).  Gently pour or squeeze the saline solution into the nasal cavity. The liquid should drain to the lower nostril if you are not overly congested.  Gently blow your nose. Blowing too hard may cause ear pain.  Repeat in the other nostril.  Clean and rinse your device with clean water and then air-dry it. Are there risks of a sinus rinse? Sinus rinse is generally very safe and effective. However, there are a few risks, which include:  A burning sensation in the sinuses. This may happen if you do not make the saline solution as directed. Make sure to follow all directions when making the saline solution.  Infection from contaminated water. This is rare, but possible.  Nasal irritation.  This information is not intended to replace advice given to you by your health care provider. Make sure you discuss any questions you have with your health care provider. Document Released: 04/18/2014 Document Revised: 08/18/2016 Document Reviewed: 02/06/2014 Elsevier Interactive Patient Education  2017 [employer].

## 2018-07-18 LAB — HM MAMMOGRAPHY

## 2018-07-29 ENCOUNTER — Encounter: Payer: Self-pay | Admitting: Internal Medicine

## 2018-09-06 ENCOUNTER — Encounter: Payer: Self-pay | Admitting: Internal Medicine

## 2018-09-06 ENCOUNTER — Ambulatory Visit (INDEPENDENT_AMBULATORY_CARE_PROVIDER_SITE_OTHER): Payer: No Typology Code available for payment source | Admitting: Internal Medicine

## 2018-09-06 ENCOUNTER — Ambulatory Visit (INDEPENDENT_AMBULATORY_CARE_PROVIDER_SITE_OTHER)
Admission: RE | Admit: 2018-09-06 | Discharge: 2018-09-06 | Disposition: A | Discharge status: Home / Self Care | Payer: No Typology Code available for payment source | Source: Ambulatory Visit | Attending: Internal Medicine | Admitting: Internal Medicine

## 2018-09-06 VITALS — BP 140/94 | HR 62 | Temp 98.1°F | Ht 63.0 in | Wt 224.0 lb

## 2018-09-06 DIAGNOSIS — M79672 Pain in left foot: Secondary | ICD-10-CM

## 2018-09-06 DIAGNOSIS — M25561 Pain in right knee: Secondary | ICD-10-CM

## 2018-09-06 DIAGNOSIS — Z0000 Encounter for general adult medical examination without abnormal findings: Secondary | ICD-10-CM | POA: Diagnosis not present

## 2018-09-06 DIAGNOSIS — M79671 Pain in right foot: Secondary | ICD-10-CM

## 2018-09-06 DIAGNOSIS — G8929 Other chronic pain: Secondary | ICD-10-CM

## 2018-09-06 DIAGNOSIS — M25562 Pain in left knee: Secondary | ICD-10-CM

## 2018-09-06 MED ORDER — MELOXICAM 15 MG PO TABS: 15.0000 mg | ORAL_TABLET | Freq: Every day | ORAL | 2 refills | Status: DC

## 2018-09-06 NOTE — Progress Notes (Signed)
Subjective:    Patient ID: Ellen Fischer, female    DOB: 06/19/1966, 52 y.o.   MRN: [id number]  HPI  Pt presents to the clinic to for her annual exam.  Flu: 07/2018 Tetanus: 07/2017 Pap Smear: 07/2017 Mammogram: 07/2018 Colon Screening: never Vision Screening: as needed Dentist: biannually  Diet: She does eat meat. She does eat some fruits and veggies. She does eat fried foods. She drinks water, tea, soda. Exercise: stretches  Review of Systems      Past Medical History:  Diagnosis Date  . Allergy   . Arthritis     Current Outpatient Medications  Medication Sig Dispense Refill  . fluticasone (FLONASE) 50 MCG/ACT nasal spray Place 1 spray into both nostrils daily. 16 g 1  . Phenyleph-CPM-DM-Aspirin (ALKA-SELTZER PLUS COLD & COUGH PO) Take by mouth.    . sodium chloride (OCEAN) 0.65 % SOLN nasal spray Place 2 sprays into both nostrils every 2 (two) hours while awake.  0   No current facility-administered medications for this visit.     Allergies  Allergen Reactions  . Sulfa Antibiotics     Pt does not remember    Family History  Problem Relation Age of Onset  . Breast cancer Paternal Aunt   . Hypertension Mother   . Stroke Mother     Social History   Socioeconomic History  . Marital status: Divorced    Spouse name: Not on file  . Number of children: Not on file  . Years of education: Not on file  . Highest education level: Not on file  Occupational History  . Not on file  Social Needs  . Financial resource strain: Not on file  . Food insecurity:    Worry: Not on file    Inability: Not on file  . Transportation needs:    Medical: Not on file    Non-medical: Not on file  Tobacco Use  . Smoking status: Never Smoker  . Smokeless tobacco: Never Used  Substance and Sexual Activity  . Alcohol use: Yes    Comment: rare  . Drug use: No  . Sexual activity: Never  Lifestyle  . Physical activity:    Days per week: Not on file    Minutes per  session: Not on file  . Stress: Not on file  Relationships  . Social connections:    Talks on phone: Not on file    Gets together: Not on file    Attends religious service: Not on file    Active member of club or organization: Not on file    Attends meetings of clubs or organizations: Not on file    Relationship status: Not on file  . Intimate partner violence:    Fear of current or ex partner: Not on file    Emotionally abused: Not on file    Physically abused: Not on file    Forced sexual activity: Not on file  Other Topics Concern  . Not on file  Social History Narrative  . Not on file     Constitutional: Denies fever, malaise, fatigue, headache or abrupt weight changes.  HEENT: Denies eye pain, eye redness, ear pain, ringing in the ears, wax buildup, runny nose, nasal congestion, bloody nose, or sore throat. Respiratory: Denies difficulty breathing, shortness of breath, cough or sputum production.   Cardiovascular: Denies chest pain, chest tightness, palpitations or swelling in the hands or feet.  Gastrointestinal: Denies abdominal pain, bloating, constipation, diarrhea or blood in the stool.  GU: Denies urgency, frequency, pain with urination, burning sensation, blood in urine, odor or discharge. Musculoskeletal: Pt reports bilateral knee and foot pain. Denies decrease in range of motion, difficulty with gait, muscle pain or joint swelling.  Skin: Denies redness, rashes, lesions or ulcercations.  Neurological: Denies dizziness, difficulty with memory, difficulty with speech or problems with balance and coordination.  Psych: Denies anxiety, depression, SI/HI.  No other specific complaints in a complete review of systems (except as listed in HPI above).  Objective:   Physical Exam    BP (!) 140/94   Pulse 62   Temp 98.1 F (36.7 C) (Oral)   Ht 5\' 3"  (1.6 m)   Wt 224 lb (101.6 kg)   LMP  (LMP Unknown) Comment: postmeopausal  SpO2 98%   BMI 39.68 kg/m  Wt Readings  from Last 3 Encounters:  09/06/18 224 lb (101.6 kg)  07/20/17 219 lb (99.3 kg)  07/06/17 219 lb (99.3 kg)    General: Appears her stated age, obese, in NAD. Skin: Warm, dry and intact.  HEENT: Head: normal shape and size; Eyes: sclera white, no icterus, conjunctiva pink, PERRLA and EOMs intact; Ears: Tm's gray and intact, normal light reflex; Throat/Mouth: Teeth present, mucosa pink and moist, no exudate, lesions or ulcerations noted.  Neck:  Neck supple, trachea midline. No masses, lumps or thyromegaly present.  Cardiovascular: Normal rate and rhythm. S1,S2 noted.  No murmur, rubs or gallops noted. No JVD or BLE edema. No carotid bruits noted. Pulmonary/Chest: Normal effort and positive vesicular breath sounds. No respiratory distress. No wheezes, rales or ronchi noted.  Abdomen: Soft and nontender. Normal bowel sounds. No distention or masses noted. Liver, spleen and kidneys non palpable. Musculoskeletal: Strength 5/5 BUE/BLE. No difficulty with gait.  Neurological: Alert and oriented. Cranial nerves II-XII grossly intact. Coordination normal.  Psychiatric: Mood and affect normal. Behavior is normal. Judgment and thought content normal.     BMET    Component Value Date/Time   NA 140 07/06/2017 0922   K 4.8 07/06/2017 0922   CL 104 07/06/2017 0922   CO2 28 07/15/2016 1552   GLUCOSE 70 07/06/2017 0922   GLUCOSE 79 07/15/2016 1552   BUN 22 07/06/2017 0922   CREATININE 0.97 07/06/2017 0922   CALCIUM 9.3 07/06/2017 0922   GFRNONAA 67 07/06/2017 0922   GFRAA 78 07/06/2017 0922    Lipid Panel     Component Value Date/Time   CHOL 187 07/06/2017 0922   TRIG 72 07/06/2017 0922   HDL 62 07/06/2017 0922   CHOLHDL 3.0 07/06/2017 0922   CHOLHDL 3 07/15/2016 1552   VLDL 17.4 07/15/2016 1552   LDLCALC 111 (H) 07/06/2017 0922    CBC    Component Value Date/Time   WBC 4.0 07/06/2017 0922   WBC 6.0 07/15/2016 1552   RBC 4.76 07/06/2017 0922   RBC 4.75 07/15/2016 1552   HGB 14.0  07/06/2017 0922   HCT 42.5 07/06/2017 0922   PLT 198 07/06/2017 0922   MCV 89 07/06/2017 0922   MCH 29.4 07/06/2017 0922   MCHC 32.9 07/06/2017 0922   MCHC 33.7 07/15/2016 1552   RDW 14.2 07/06/2017 0922   LYMPHSABS 1.1 07/06/2017 0922   EOSABS 0.1 07/06/2017 0922   BASOSABS 0.0 07/06/2017 0922    Hgb A1C Lab Results  Component Value Date   HGBA1C 5.3 07/06/2017          Assessment & Plan:   Preventative Health Maintenance:  Flu shot UTD Tetanus UTD Pap smear  and mammogram UTD She declines coloscopy or cologuard at this time Encouraged her to consume a balanced diet and exercise regimen Advised her to see an eye doctor and dentist annually Will check CBC, CMET, Lipid, Vit D today  Bilateral Foot Pain:  Xray bilateral feet today Encouraged weight loss RX for Meloxicam 15 mg daily, avoid all other OTC NSAIDs  Bilateral Knee Pain:  Xray bilateral knees today Encouraged weight loss RX for Meloxicam 15 mg daily, avoid all other OTC NSAIDs  Will follow up after xrays and labs, return precautions discussed Nicki Reaper, NP

## 2018-09-06 NOTE — Patient Instructions (Signed)
Health Maintenance for Postmenopausal Women Menopause is a normal process in which your reproductive ability comes to an end. This process happens gradually over a span of months to years, usually between the ages of 22 and 9. Menopause is complete when you have missed 12 consecutive menstrual periods. It is important to talk with your health care provider about some of the most common conditions that affect postmenopausal women, such as heart disease, cancer, and bone loss (osteoporosis). Adopting a healthy lifestyle and getting preventive care can help to promote your health and wellness. Those actions can also lower your chances of developing some of these common conditions. What should I know about menopause? During menopause, you may experience a number of symptoms, such as:  Moderate-to-severe hot flashes.  Night sweats.  Decrease in sex drive.  Mood swings.  Headaches.  Tiredness.  Irritability.  Memory problems.  Insomnia.  Choosing to treat or not to treat menopausal changes is an individual decision that you make with your health care provider. What should I know about hormone replacement therapy and supplements? Hormone therapy products are effective for treating symptoms that are associated with menopause, such as hot flashes and night sweats. Hormone replacement carries certain risks, especially as you become older. If you are thinking about using estrogen or estrogen with progestin treatments, discuss the benefits and risks with your health care provider. What should I know about heart disease and stroke? Heart disease, heart attack, and stroke become more likely as you age. This may be due, in part, to the hormonal changes that your body experiences during menopause. These can affect how your body processes dietary fats, triglycerides, and cholesterol. Heart attack and stroke are both medical emergencies. There are many things that you can do to help prevent heart disease  and stroke:  Have your blood pressure checked at least every 1-2 years. High blood pressure causes heart disease and increases the risk of stroke.  If you are 53-22 years old, ask your health care provider if you should take aspirin to prevent a heart attack or a stroke.  Do not use any tobacco products, including cigarettes, chewing tobacco, or electronic cigarettes. If you need help quitting, ask your health care provider.  It is important to eat a healthy diet and maintain a healthy weight. ? Be sure to include plenty of vegetables, fruits, low-fat dairy products, and lean protein. ? Avoid eating foods that are high in solid fats, added sugars, or salt (sodium).  Get regular exercise. This is one of the most important things that you can do for your health. ? Try to exercise for at least 150 minutes each week. The type of exercise that you do should increase your heart rate and make you sweat. This is known as moderate-intensity exercise. ? Try to do strengthening exercises at least twice each week. Do these in addition to the moderate-intensity exercise.  Know your numbers.Ask your health care provider to check your cholesterol and your blood glucose. Continue to have your blood tested as directed by your health care provider.  What should I know about cancer screening? There are several types of cancer. Take the following steps to reduce your risk and to catch any cancer development as early as possible. Breast Cancer  Practice breast self-awareness. ? This means understanding how your breasts normally appear and feel. ? It also means doing regular breast self-exams. Let your health care provider know about any changes, no matter how small.  If you are 40  or older, have a clinician do a breast exam (clinical breast exam or CBE) every year. Depending on your age, family history, and medical history, it may be recommended that you also have a yearly breast X-ray (mammogram).  If you  have a family history of breast cancer, talk with your health care provider about genetic screening.  If you are at high risk for breast cancer, talk with your health care provider about having an MRI and a mammogram every year.  Breast cancer (BRCA) gene test is recommended for women who have family members with BRCA-related cancers. Results of the assessment will determine the need for genetic counseling and BRCA1 and for BRCA2 testing. BRCA-related cancers include these types: ? Breast. This occurs in males or females. ? Ovarian. ? Tubal. This may also be called fallopian tube cancer. ? Cancer of the abdominal or pelvic lining (peritoneal cancer). ? Prostate. ? Pancreatic.  Cervical, Uterine, and Ovarian Cancer Your health care provider may recommend that you be screened regularly for cancer of the pelvic organs. These include your ovaries, uterus, and vagina. This screening involves a pelvic exam, which includes checking for microscopic changes to the surface of your cervix (Pap test).  For women ages 21-65, health care providers may recommend a pelvic exam and a Pap test every three years. For women ages 79-65, they may recommend the Pap test and pelvic exam, combined with testing for human papilloma virus (HPV), every five years. Some types of HPV increase your risk of cervical cancer. Testing for HPV may also be done on women of any age who have unclear Pap test results.  Other health care providers may not recommend any screening for nonpregnant women who are considered low risk for pelvic cancer and have no symptoms. Ask your health care provider if a screening pelvic exam is right for you.  If you have had past treatment for cervical cancer or a condition that could lead to cancer, you need Pap tests and screening for cancer for at least 20 years after your treatment. If Pap tests have been discontinued for you, your risk factors (such as having a new sexual partner) need to be  reassessed to determine if you should start having screenings again. Some women have medical problems that increase the chance of getting cervical cancer. In these cases, your health care provider may recommend that you have screening and Pap tests more often.  If you have a family history of uterine cancer or ovarian cancer, talk with your health care provider about genetic screening.  If you have vaginal bleeding after reaching menopause, tell your health care provider.  There are currently no reliable tests available to screen for ovarian cancer.  Lung Cancer Lung cancer screening is recommended for adults 69-62 years old who are at high risk for lung cancer because of a history of smoking. A yearly low-dose CT scan of the lungs is recommended if you:  Currently smoke.  Have a history of at least 30 pack-years of smoking and you currently smoke or have quit within the past 15 years. A pack-year is smoking an average of one pack of cigarettes per day for one year.  Yearly screening should:  Continue until it has been 15 years since you quit.  Stop if you develop a health problem that would prevent you from having lung cancer treatment.  Colorectal Cancer  This type of cancer can be detected and can often be prevented.  Routine colorectal cancer screening usually begins at  age 42 and continues through age 45.  If you have risk factors for colon cancer, your health care provider may recommend that you be screened at an earlier age.  If you have a family history of colorectal cancer, talk with your health care provider about genetic screening.  Your health care provider may also recommend using home test kits to check for hidden blood in your stool.  A small camera at the end of a tube can be used to examine your colon directly (sigmoidoscopy or colonoscopy). This is done to check for the earliest forms of colorectal cancer.  Direct examination of the colon should be repeated every  5-10 years until age 71. However, if early forms of precancerous polyps or small growths are found or if you have a family history or genetic risk for colorectal cancer, you may need to be screened more often.  Skin Cancer  Check your skin from head to toe regularly.  Monitor any moles. Be sure to tell your health care provider: ? About any new moles or changes in moles, especially if there is a change in a mole's shape or color. ? If you have a mole that is larger than the size of a pencil eraser.  If any of your family members has a history of skin cancer, especially at a young age, talk with your health care provider about genetic screening.  Always use sunscreen. Apply sunscreen liberally and repeatedly throughout the day.  Whenever you are outside, protect yourself by wearing long sleeves, pants, a wide-brimmed hat, and sunglasses.  What should I know about osteoporosis? Osteoporosis is a condition in which bone destruction happens more quickly than new bone creation. After menopause, you may be at an increased risk for osteoporosis. To help prevent osteoporosis or the bone fractures that can happen because of osteoporosis, the following is recommended:  If you are 46-71 years old, get at least 1,000 mg of calcium and at least 600 mg of vitamin D per day.  If you are older than age 55 but younger than age 65, get at least 1,200 mg of calcium and at least 600 mg of vitamin D per day.  If you are older than age 54, get at least 1,200 mg of calcium and at least 800 mg of vitamin D per day.  Smoking and excessive alcohol intake increase the risk of osteoporosis. Eat foods that are rich in calcium and vitamin D, and do weight-bearing exercises several times each week as directed by your health care provider. What should I know about how menopause affects my mental health? Depression may occur at any age, but it is more common as you become older. Common symptoms of depression  include:  Low or sad mood.  Changes in sleep patterns.  Changes in appetite or eating patterns.  Feeling an overall lack of motivation or enjoyment of activities that you previously enjoyed.  Frequent crying spells.  Talk with your health care provider if you think that you are experiencing depression. What should I know about immunizations? It is important that you get and maintain your immunizations. These include:  Tetanus, diphtheria, and pertussis (Tdap) booster vaccine.  Influenza every year before the flu season begins.  Pneumonia vaccine.  Shingles vaccine.  Your health care provider may also recommend other immunizations. This information is not intended to replace advice given to you by your health care provider. Make sure you discuss any questions you have with your health care provider. Document Released: 11/13/2005  Document Revised: 04/10/2016 Document Reviewed: 06/25/2015 Elsevier Interactive Patient Education  2018 [employer]  

## 2018-09-07 LAB — COMPREHENSIVE METABOLIC PANEL
ALT: 17 U/L (ref 0–35)
AST: 18 U/L (ref 0–37)
Albumin: 4.2 g/dL (ref 3.5–5.2)
Alkaline Phosphatase: 84 U/L (ref 39–117)
BUN: 21 mg/dL (ref 6–23)
CHLORIDE: 104 meq/L (ref 96–112)
CO2: 26 meq/L (ref 19–32)
Calcium: 9.8 mg/dL (ref 8.4–10.5)
Creatinine, Ser: 0.99 mg/dL (ref 0.40–1.20)
GFR: 62.09 mL/min (ref >60.00)
Glucose, Bld: 86 mg/dL (ref 70–99)
POTASSIUM: 4.3 meq/L (ref 3.5–5.1)
Sodium: 137 mEq/L (ref 135–145)
Total Bilirubin: 0.4 mg/dL (ref 0.2–1.2)
Total Protein: 7.6 g/dL (ref 6.0–8.3)

## 2018-09-07 LAB — CBC
HEMATOCRIT: 43.3 % (ref 36.0–46.0)
HEMOGLOBIN: 14.5 g/dL (ref 12.0–15.0)
MCHC: 33.4 g/dL (ref 30.0–36.0)
MCV: 88.5 fl (ref 78.0–100.0)
PLATELETS: 216 10*3/uL (ref 150.0–400.0)
RBC: 4.89 Mil/uL (ref 3.87–5.11)
RDW: 14.6 % (ref 11.5–15.5)
WBC: 5.3 10*3/uL (ref 4.0–10.5)

## 2018-09-07 LAB — VITAMIN D 25 HYDROXY (VIT D DEFICIENCY, FRACTURES): VITD: 24.13 ng/mL — ABNORMAL LOW (ref 30.00–100.00)

## 2018-09-07 LAB — LIPID PANEL
CHOL/HDL RATIO: 3
Cholesterol: 193 mg/dL (ref 0–200)
HDL: 64.6 mg/dL (ref >39.00)
LDL CALC: 110 mg/dL — AB (ref 0–99)
NonHDL: 128.37
TRIGLYCERIDES: 90 mg/dL (ref 0.0–149.0)
VLDL: 18 mg/dL (ref 0.0–40.0)

## 2019-07-20 LAB — HM MAMMOGRAPHY

## 2019-09-11 ENCOUNTER — Encounter: Payer: Self-pay | Admitting: Internal Medicine

## 2019-09-11 ENCOUNTER — Other Ambulatory Visit: Payer: Self-pay

## 2019-09-11 ENCOUNTER — Ambulatory Visit (INDEPENDENT_AMBULATORY_CARE_PROVIDER_SITE_OTHER): Payer: No Typology Code available for payment source | Admitting: Internal Medicine

## 2019-09-11 VITALS — BP 132/80 | HR 70 | Temp 97.6°F | Ht 63.5 in | Wt 220.0 lb

## 2019-09-11 DIAGNOSIS — M25562 Pain in left knee: Secondary | ICD-10-CM

## 2019-09-11 DIAGNOSIS — M79671 Pain in right foot: Secondary | ICD-10-CM

## 2019-09-11 DIAGNOSIS — E559 Vitamin D deficiency, unspecified: Secondary | ICD-10-CM

## 2019-09-11 DIAGNOSIS — M25561 Pain in right knee: Secondary | ICD-10-CM

## 2019-09-11 DIAGNOSIS — G8929 Other chronic pain: Secondary | ICD-10-CM

## 2019-09-11 DIAGNOSIS — Z0000 Encounter for general adult medical examination without abnormal findings: Secondary | ICD-10-CM | POA: Diagnosis not present

## 2019-09-11 DIAGNOSIS — M79672 Pain in left foot: Secondary | ICD-10-CM

## 2019-09-11 DIAGNOSIS — M1990 Unspecified osteoarthritis, unspecified site: Secondary | ICD-10-CM | POA: Insufficient documentation

## 2019-09-11 LAB — COMPREHENSIVE METABOLIC PANEL
ALT: 13 U/L (ref 0–35)
AST: 18 U/L (ref 0–37)
Albumin: 4 g/dL (ref 3.5–5.2)
Alkaline Phosphatase: 85 U/L (ref 39–117)
BUN: 18 mg/dL (ref 6–23)
CO2: 27 mEq/L (ref 19–32)
Calcium: 9.3 mg/dL (ref 8.4–10.5)
Chloride: 107 mEq/L (ref 96–112)
Creatinine, Ser: 1.01 mg/dL (ref 0.40–1.20)
GFR: 56.87 mL/min — ABNORMAL LOW (ref >60.00)
Glucose, Bld: 87 mg/dL (ref 70–99)
Potassium: 4.2 mEq/L (ref 3.5–5.1)
Sodium: 140 mEq/L (ref 135–145)
Total Bilirubin: 0.4 mg/dL (ref 0.2–1.2)
Total Protein: 7.2 g/dL (ref 6.0–8.3)

## 2019-09-11 LAB — LIPID PANEL
Cholesterol: 181 mg/dL (ref 0–200)
HDL: 62.6 mg/dL (ref >39.00)
LDL Cholesterol: 107 mg/dL — ABNORMAL HIGH (ref 0–99)
NonHDL: 118.87
Total CHOL/HDL Ratio: 3
Triglycerides: 60 mg/dL (ref 0.0–149.0)
VLDL: 12 mg/dL (ref 0.0–40.0)

## 2019-09-11 LAB — CBC
HCT: 44.1 % (ref 36.0–46.0)
Hemoglobin: 14.5 g/dL (ref 12.0–15.0)
MCHC: 32.9 g/dL (ref 30.0–36.0)
MCV: 89.6 fl (ref 78.0–100.0)
Platelets: 203 10*3/uL (ref 150.0–400.0)
RBC: 4.93 Mil/uL (ref 3.87–5.11)
RDW: 14.5 % (ref 11.5–15.5)
WBC: 4.9 10*3/uL (ref 4.0–10.5)

## 2019-09-11 LAB — HEMOGLOBIN A1C: Hgb A1c MFr Bld: 5.4 % (ref 4.6–6.5)

## 2019-09-11 LAB — VITAMIN D 25 HYDROXY (VIT D DEFICIENCY, FRACTURES): VITD: 17.05 ng/mL — ABNORMAL LOW (ref 30.00–100.00)

## 2019-09-11 MED ORDER — MELOXICAM 15 MG PO TABS: 15.0000 mg | ORAL_TABLET | Freq: Every day | ORAL | 1 refills | Status: DC

## 2019-09-11 NOTE — Patient Instructions (Signed)

## 2019-09-11 NOTE — Progress Notes (Signed)
Subjective:    Patient ID: Ellen Fischer, female    DOB: 05/07/1966, 53 y.o.   MRN: [id number]  HPI  Pt presents to the clinic today for her annual exam.  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 She takes Meloxicam as needed with good relief. She would like refills of this today.  Flu: 07/2019 Tetanus: 07/2017 Pap Smear: 07/2017 Mammogram: 07/2019, [facility] Colon Screening: never Vision Screening: as needed Dentist: biannually  Diet: She does eat meat. She consumes fruits and veggies daily. She does eat fried foods. She drinks mostly water and Soda. Exercise: Walking, stretches, exercise bike.  Review of Systems      Past Medical History:  Diagnosis Date  . Allergy   . Arthritis     Current Outpatient Medications  Medication Sig Dispense Refill  . fluticasone (FLONASE) 50 MCG/ACT nasal spray Place 1 spray into both nostrils daily. 16 g 1  . meloxicam (MOBIC) 15 MG tablet Take 1 tablet (15 mg total) by mouth daily. 30 tablet 2  . Phenyleph-CPM-DM-Aspirin (ALKA-SELTZER PLUS COLD & COUGH PO) Take by mouth.    . sodium chloride (OCEAN) 0.65 % SOLN nasal spray Place 2 sprays into both nostrils every 2 (two) hours while awake.  0   No current facility-administered medications for this visit.     Allergies  Allergen Reactions  . Sulfa Antibiotics     Pt does not remember    Family History  Problem Relation Age of Onset  . Breast cancer Paternal Aunt   . Hypertension Mother   . Stroke Mother     Social History   Socioeconomic History  . Marital status: Divorced    Spouse name: Not on file  . Number of children: Not on file  . Years of education: Not on file  . Highest education level: Not on file  Occupational History  . Not on file  Social Needs  . Financial resource strain: Not on file  . Food insecurity    Worry: Not on file    Inability: Not on file  . Transportation needs    Medical: Not on file    Non-medical: Not on file   Tobacco Use  . Smoking status: Never Smoker  . Smokeless tobacco: Never Used  Substance and Sexual Activity  . Alcohol use: Yes    Comment: rare  . Drug use: No  . Sexual activity: Never  Lifestyle  . Physical activity    Days per week: Not on file    Minutes per session: Not on file  . Stress: Not on file  Relationships  . Social Musicianconnections    Talks on phone: Not on file    Gets together: Not on file    Attends religious service: Not on file    Active member of club or organization: Not on file    Attends meetings of clubs or organizations: Not on file    Relationship status: Not on file  . Intimate partner violence    Fear of current or ex partner: Not on file    Emotionally abused: Not on file    Physically abused: Not on file    Forced sexual activity: Not on file  Other Topics Concern  . Not on file  Social History Narrative  . Not on file     Constitutional: Denies fever, malaise, fatigue, headache or abrupt weight changes.  HEENT: Denies eye pain, eye redness, ear pain, ringing in the ears, wax buildup,  runny nose, nasal congestion, bloody nose, or sore throat. Respiratory: Denies difficulty breathing, shortness of breath, cough or sputum production.   Cardiovascular: Denies chest pain, chest tightness, palpitations or swelling in the hands or feet.  Gastrointestinal: Denies abdominal pain, bloating, constipation, diarrhea or blood in the stool.  GU: Denies urgency, frequency, pain with urination, burning sensation, blood in urine, odor or discharge. Musculoskeletal: Pt reports bilateral knee and foot pain. Denies decrease in range of motion, difficulty with gait, muscle pain or joint swelling.  Skin: Denies redness, rashes, lesions or ulcercations.  Neurological: Denies dizziness, difficulty with memory, difficulty with speech or problems with balance and coordination.  Psych: Denies anxiety, depression, SI/HI.  No other specific complaints in a complete review  of systems (except as listed in HPI above).  Objective:   Physical Exam  BP 132/80   Pulse 70   Temp 97.6 F (36.4 C) (Temporal)   Ht 5' 3.5" (1.613 m)   Wt 220 lb (99.8 kg)   LMP  (LMP Unknown) Comment: postmeopausal  SpO2 98%   BMI 38.36 kg/m   Wt Readings from Last 3 Encounters:  09/06/18 224 lb (101.6 kg)  07/20/17 219 lb (99.3 kg)  07/06/17 219 lb (99.3 kg)    General: Appears her stated age, obese, in NAD. Skin: Warm, dry and intact. No rashes, lesions or ulcerations noted. HEENT: Head: normal shape and size; Eyes: sclera white, no icterus, conjunctiva pink, PERRLA and EOMs intact; Ears: Tm's gray and intact, normal light reflex;  Neck:  Neck supple, trachea midline. No masses, lumps or thyromegaly present.  Cardiovascular: Normal rate and rhythm. S1,S2 noted.  No murmur, rubs or gallops noted. No JVD or BLE edema. No carotid bruits noted. Pulmonary/Chest: Normal effort and positive vesicular breath sounds. No respiratory distress. No wheezes, rales or ronchi noted.  Abdomen: Soft and nontender. Normal bowel sounds. No distention or masses noted. Liver, spleen and kidneys non palpable. Musculoskeletal: Strength 5/5 BEU/BLE. No difficulty with gait.  Neurological: Alert and oriented. Cranial nerves II-XII grossly intact. Coordination normal.  Psychiatric: Mood and affect normal. Behavior is normal. Judgment and thought content normal.     BMET    Component Value Date/Time   NA 137 09/06/2018 1541   NA 140 07/06/2017 0922   K 4.3 09/06/2018 1541   CL 104 09/06/2018 1541   CO2 26 09/06/2018 1541   GLUCOSE 86 09/06/2018 1541   BUN 21 09/06/2018 1541   BUN 22 07/06/2017 0922   CREATININE 0.99 09/06/2018 1541   CALCIUM 9.8 09/06/2018 1541   GFRNONAA 67 07/06/2017 0922   GFRAA 78 07/06/2017 0922    Lipid Panel     Component Value Date/Time   CHOL 193 09/06/2018 1541   CHOL 187 07/06/2017 0922   TRIG 90.0 09/06/2018 1541   HDL 64.60 09/06/2018 1541   HDL 62  07/06/2017 0922   CHOLHDL 3 09/06/2018 1541   VLDL 18.0 09/06/2018 1541   LDLCALC 110 (H) 09/06/2018 1541   LDLCALC 111 (H) 07/06/2017 0922    CBC    Component Value Date/Time   WBC 5.3 09/06/2018 1541   RBC 4.89 09/06/2018 1541   HGB 14.5 09/06/2018 1541   HGB 14.0 07/06/2017 0922   HCT 43.3 09/06/2018 1541   HCT 42.5 07/06/2017 0922   PLT 216.0 09/06/2018 1541   PLT 198 07/06/2017 0922   MCV 88.5 09/06/2018 1541   MCV 89 07/06/2017 0922   MCH 29.4 07/06/2017 0922   MCHC 33.4 09/06/2018 1541  RDW 14.6 09/06/2018 1541   RDW 14.2 07/06/2017 0922   LYMPHSABS 1.1 07/06/2017 0922   EOSABS 0.1 07/06/2017 0922   BASOSABS 0.0 07/06/2017 0922    Hgb A1C Lab Results  Component Value Date   HGBA1C 5.3 07/06/2017           Assessment & Plan:   Preventative Health Maintenance:  Flu shot UTD Tetanus UTD Pap smear UTD Mammogram UTD Colon Screening- declines colonoscopy, aggreeable to Cologuard, will check with insurance. Encouraged her to see an eye doctor and dentist annually Advised her to see an eye doctor and dentist annually Will check CBC, CMET, Lipid, A1C and Vit D  RTC in 1 year, sooner if needed Nicki Reaper, NP This visit occurred during the SARS-CoV-2 public health emergency.  Safety protocols were in place, including screening questions prior to the visit, additional usage of staff PPE, and extensive cleaning of exam room while observing appropriate contact time as indicated for disinfecting solutions.

## 2019-09-11 NOTE — Assessment & Plan Note (Signed)
Discussed how weight loss could help reduce joint pain Encouraged regular stretching and physical activity Continue Meloxicam prn- refilled today CMET today

## 2019-09-12 MED ORDER — VITAMIN D (ERGOCALCIFEROL) 1.25 MG (50000 UNIT) PO CAPS: 50000.0000 [IU] | ORAL_CAPSULE | ORAL | 0 refills | Status: DC

## 2019-09-12 NOTE — Addendum Note (Signed)
Addended by: Jearld Fenton on: 09/12/2019 08:51 AM   Modules accepted: Orders

## 2019-11-28 ENCOUNTER — Other Ambulatory Visit: Payer: Self-pay | Admitting: Internal Medicine

## 2020-09-12 ENCOUNTER — Encounter: Payer: No Typology Code available for payment source | Admitting: Internal Medicine

## 2020-10-22 ENCOUNTER — Encounter: Payer: No Typology Code available for payment source | Admitting: Internal Medicine

## 2020-12-17 ENCOUNTER — Ambulatory Visit (INDEPENDENT_AMBULATORY_CARE_PROVIDER_SITE_OTHER): Payer: No Typology Code available for payment source | Admitting: Internal Medicine

## 2020-12-17 ENCOUNTER — Encounter: Payer: Self-pay | Admitting: Internal Medicine

## 2020-12-17 ENCOUNTER — Other Ambulatory Visit: Payer: Self-pay

## 2020-12-17 VITALS — BP 134/84 | HR 66 | Temp 96.5°F | Ht 63.5 in | Wt 209.0 lb

## 2020-12-17 DIAGNOSIS — Z0000 Encounter for general adult medical examination without abnormal findings: Secondary | ICD-10-CM | POA: Diagnosis not present

## 2020-12-17 DIAGNOSIS — M8949 Other hypertrophic osteoarthropathy, multiple sites: Secondary | ICD-10-CM | POA: Diagnosis not present

## 2020-12-17 DIAGNOSIS — M159 Polyosteoarthritis, unspecified: Secondary | ICD-10-CM

## 2020-12-17 DIAGNOSIS — R030 Elevated blood-pressure reading, without diagnosis of hypertension: Secondary | ICD-10-CM | POA: Diagnosis not present

## 2020-12-17 DIAGNOSIS — Z0001 Encounter for general adult medical examination with abnormal findings: Secondary | ICD-10-CM

## 2020-12-17 LAB — COMPREHENSIVE METABOLIC PANEL
ALT: 14 U/L (ref 0–35)
AST: 18 U/L (ref 0–37)
Albumin: 4.1 g/dL (ref 3.5–5.2)
Alkaline Phosphatase: 94 U/L (ref 39–117)
BUN: 20 mg/dL (ref 6–23)
CO2: 27 mEq/L (ref 19–32)
Calcium: 9.6 mg/dL (ref 8.4–10.5)
Chloride: 103 mEq/L (ref 96–112)
Creatinine, Ser: 1.01 mg/dL (ref 0.40–1.20)
GFR: 62.26 mL/min (ref >60.00)
Glucose, Bld: 80 mg/dL (ref 70–99)
Potassium: 4.6 mEq/L (ref 3.5–5.1)
Sodium: 137 mEq/L (ref 135–145)
Total Bilirubin: 0.5 mg/dL (ref 0.2–1.2)
Total Protein: 7.2 g/dL (ref 6.0–8.3)

## 2020-12-17 LAB — CBC
HCT: 44.7 % (ref 36.0–46.0)
Hemoglobin: 14.7 g/dL (ref 12.0–15.0)
MCHC: 32.8 g/dL (ref 30.0–36.0)
MCV: 89.6 fl (ref 78.0–100.0)
Platelets: 201 10*3/uL (ref 150.0–400.0)
RBC: 4.99 Mil/uL (ref 3.87–5.11)
RDW: 14.6 % (ref 11.5–15.5)
WBC: 4.6 10*3/uL (ref 4.0–10.5)

## 2020-12-17 LAB — LIPID PANEL
Cholesterol: 206 mg/dL — ABNORMAL HIGH (ref 0–200)
HDL: 64.8 mg/dL (ref >39.00)
LDL Cholesterol: 125 mg/dL — ABNORMAL HIGH (ref 0–99)
NonHDL: 141.42
Total CHOL/HDL Ratio: 3
Triglycerides: 80 mg/dL (ref 0.0–149.0)
VLDL: 16 mg/dL (ref 0.0–40.0)

## 2020-12-17 LAB — HEMOGLOBIN A1C: Hgb A1c MFr Bld: 5.4 % (ref 4.6–6.5)

## 2020-12-17 NOTE — Patient Instructions (Signed)

## 2020-12-17 NOTE — Progress Notes (Signed)
Subjective:    Patient ID: Ellen Fischer, female    DOB: 31-Jul-1966, 55 y.o.   MRN: [id number]  HPI  Patient presents the clinic today for her annual exam.  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  She takes Meloxicam as needed with good relief of symptoms.  She does not follow with orthopedics.  Elevated Blood Pressure with HTN: Her BP today is 134/84. She has been has high as 140/90 in the past. She has never been treated for high blood pressure in the past.  Flu: 06/2020 Tetanus: 07/2017 Covid: Pfizer x 2 Shingrix: never Pap smear: 07/2017 Mammogram: 07/2019, every 2 years Colon screening: never Vision screening: As needed Dentist: Biannually  Diet: She does eat meat.  She consumes fruits and vegetables.  She does eat fried foods.  She drinks mostly water and soda. Exercise: Walking, stretches, exercise bike  Review of Systems      Past Medical History:  Diagnosis Date  . Allergy   . Arthritis     Current Outpatient Medications  Medication Sig Dispense Refill  . fluticasone (FLONASE) 50 MCG/ACT nasal spray Place 1 spray into both nostrils daily. 16 g 1  . meloxicam (MOBIC) 15 MG tablet Take 1 tablet (15 mg total) by mouth daily. 90 tablet 1  . Phenyleph-CPM-DM-Aspirin (ALKA-SELTZER PLUS COLD & COUGH PO) Take by mouth.    . sodium chloride (OCEAN) 0.65 % SOLN nasal spray Place 2 sprays into both nostrils every 2 (two) hours while awake.  0  . Vitamin D, Ergocalciferol, (DRISDOL) 1.25 MG (50000 UT) CAPS capsule Take 1 capsule (50,000 Units total) by mouth every 7 (seven) days. 12 capsule 0   No current facility-administered medications for this visit.    Allergies  Allergen Reactions  . Sulfa Antibiotics     Pt does not remember    Family History  Problem Relation Age of Onset  . Breast cancer Paternal Aunt   . Hypertension Mother   . Stroke Mother     Social History   Socioeconomic History  . Marital status: Divorced    Spouse name: Not on file  .  Number of children: Not on file  . Years of education: Not on file  . Highest education level: Not on file  Occupational History  . Not on file  Tobacco Use  . Smoking status: Never Smoker  . Smokeless tobacco: Never Used  Substance and Sexual Activity  . Alcohol use: Yes    Comment: rare  . Drug use: No  . Sexual activity: Never  Other Topics Concern  . Not on file  Social History Narrative  . Not on file   Social Determinants of Health   Financial Resource Strain: Not on file  Food Insecurity: Not on file  Transportation Needs: Not on file  Physical Activity: Not on file  Stress: Not on file  Social Connections: Not on file  Intimate Partner Violence: Not on file     Constitutional: Denies fever, malaise, fatigue, headache or abrupt weight changes.  HEENT: Denies eye pain, eye redness, ear pain, ringing in the ears, wax buildup, runny nose, nasal congestion, bloody nose, or sore throat. Respiratory: Denies difficulty breathing, shortness of breath, cough or sputum production.   Cardiovascular: Denies chest pain, chest tightness, palpitations or swelling in the hands or feet.  Gastrointestinal: Denies abdominal pain, bloating, constipation, diarrhea or blood in the stool.  GU: Denies urgency, frequency, pain with urination, burning sensation, blood in urine, odor or discharge.  Musculoskeletal: Pt reports intermittent joint pain. Denies decrease in range of motion, difficulty with gait, muscle pain or joint swelling.  Skin: Denies redness, rashes, lesions or ulcercations.  Neurological: Denies dizziness, difficulty with memory, difficulty with speech or problems with balance and coordination.  Psych: Denies anxiety, depression, SI/HI.  No other specific complaints in a complete review of systems (except as listed in HPI above).  Objective:   Physical Exam   BP 134/84   Pulse 66   Temp (!) 96.5 F (35.8 C) (Temporal)   Ht 5' 3.5" (1.613 m)   Wt 209 lb (94.8 kg)    LMP  (LMP Unknown) Comment: postmeopausal  SpO2 98%   BMI 36.44 kg/m   Wt Readings from Last 3 Encounters:  09/11/19 220 lb (99.8 kg)  09/06/18 224 lb (101.6 kg)  07/20/17 219 lb (99.3 kg)    General: Appears her stated age, obese, in NAD. Skin: Warm, dry and intact. No rashes noted. HEENT: Head: normal shape and size; Eyes: sclera white, no icterus, conjunctiva pink, PERRLA and EOMs intact; Neck:  Neck supple, trachea midline. No masses, lumps or thyromegaly present.  Cardiovascular: Normal rate and rhythm. S1,S2 noted.  No murmur, rubs or gallops noted. No JVD or BLE edema. No carotid bruits noted. Pulmonary/Chest: Normal effort and positive vesicular breath sounds. No respiratory distress. No wheezes, rales or ronchi noted.  Abdomen: Soft and nontender. Normal bowel sounds. No distention or masses noted. Liver, spleen and kidneys non palpable. Musculoskeletal: Strength 5/5 BUE/BLE. No difficulty with gait.  Neurological: Alert and oriented. Cranial nerves II-XII grossly intact. Coordination normal.  Psychiatric: Mood and affect normal. Behavior is normal. Judgment and thought content normal.    BMET    Component Value Date/Time   NA 140 09/11/2019 1000   NA 140 07/06/2017 0922   K 4.2 09/11/2019 1000   CL 107 09/11/2019 1000   CO2 27 09/11/2019 1000   GLUCOSE 87 09/11/2019 1000   BUN 18 09/11/2019 1000   BUN 22 07/06/2017 0922   CREATININE 1.01 09/11/2019 1000   CALCIUM 9.3 09/11/2019 1000   GFRNONAA 67 07/06/2017 0922   GFRAA 78 07/06/2017 0922    Lipid Panel     Component Value Date/Time   CHOL 181 09/11/2019 1000   CHOL 187 07/06/2017 0922   TRIG 60.0 09/11/2019 1000   HDL 62.60 09/11/2019 1000   HDL 62 07/06/2017 0922   CHOLHDL 3 09/11/2019 1000   VLDL 12.0 09/11/2019 1000   LDLCALC 107 (H) 09/11/2019 1000   LDLCALC 111 (H) 07/06/2017 0922    CBC    Component Value Date/Time   WBC 4.9 09/11/2019 1000   RBC 4.93 09/11/2019 1000   HGB 14.5 09/11/2019  1000   HGB 14.0 07/06/2017 0922   HCT 44.1 09/11/2019 1000   HCT 42.5 07/06/2017 0922   PLT 203.0 09/11/2019 1000   PLT 198 07/06/2017 0922   MCV 89.6 09/11/2019 1000   MCV 89 07/06/2017 0922   MCH 29.4 07/06/2017 0922   MCHC 32.9 09/11/2019 1000   RDW 14.5 09/11/2019 1000   RDW 14.2 07/06/2017 0922   LYMPHSABS 1.1 07/06/2017 0922   EOSABS 0.1 07/06/2017 0922   BASOSABS 0.0 07/06/2017 0922    Hgb A1C Lab Results  Component Value Date   HGBA1C 5.4 09/11/2019           Assessment & Plan:   Preventative Health Maintenance:  Flu shot UTD Tetanus UTD Encouraged her to get her Covid booster Encouraged  her to get a Shingrix vaccine Pap smear UTD She will get her mammogram when the mobile bus comes to [employer] She declines colon cancer screening Encouraged her to consume a balanced diet and exercise regimen Advised her to see an eye doctor and dentist annually  Will check CBC, CMET, Lipid, A1C today  RTC in 1 year, sooner if needed  Nicki Reaper, NP This visit occurred during the SARS-CoV-2 public health emergency.  Safety protocols were in place, including screening questions prior to the visit, additional usage of staff PPE, and extensive cleaning of exam room while observing appropriate contact time as indicated for disinfecting solutions.

## 2020-12-17 NOTE — Assessment & Plan Note (Signed)
She declines medication therapy at this time Encouraged DASH diet and exercise for weigh loss Advised her to have the RN at [facility] check her blood pressure monthly for monitoring

## 2021-06-05 ENCOUNTER — Ambulatory Visit: Payer: Self-pay | Admitting: Registered Nurse

## 2021-06-05 ENCOUNTER — Other Ambulatory Visit: Payer: Self-pay

## 2021-06-05 VITALS — BP 138/98 | HR 71 | Temp 98.3°F

## 2021-06-05 DIAGNOSIS — S86112A Strain of other muscle(s) and tendon(s) of posterior muscle group at lower leg level, left leg, initial encounter: Secondary | ICD-10-CM

## 2021-06-05 DIAGNOSIS — M25562 Pain in left knee: Secondary | ICD-10-CM

## 2021-06-05 MED ORDER — NAPROXEN 500 MG PO TABS: 220.0000 mg | ORAL_TABLET | Freq: Two times a day (BID) | ORAL | 0 refills | Status: AC

## 2021-06-05 NOTE — Progress Notes (Signed)
Subjective:    Patient ID: Ellen Fischer, female    DOB: [birth year], 55 y.o.   MRN: [id number]  54y/o Caucasian established female pt c/o L knee/leg pain x2-3 weeks. At times will feel like L leg is just going to give out from under her. No falls. Pain will extend from lateral knee down into lateral leg and calf. Sometimes radiates up side of thigh.  Calf left hurts at times also and pain is deep in her knee (center) with walking. Can occur any time of day, not necessarily after activity. Pain has also woken  her up at night.  Taking aleve 1 tab prn OTC prn pain.  Has tried to rest knee and elevate.  Wears compression socks knee high for leg support.  Hasn't tried ice.  Denied new exercise program or job duties or known injury/fall.     Review of Systems  Constitutional:  Negative for activity change, appetite change, chills, diaphoresis, fatigue and fever.  HENT:  Negative for trouble swallowing and voice change.   Eyes:  Negative for photophobia and visual disturbance.  Respiratory:  Negative for cough, shortness of breath, wheezing and stridor.   Cardiovascular:  Negative for chest pain.  Gastrointestinal:  Negative for diarrhea, nausea and vomiting.  Endocrine: Negative for cold intolerance and heat intolerance.  Genitourinary:  Negative for difficulty urinating.  Musculoskeletal:  Positive for arthralgias, gait problem and myalgias. Negative for back pain, neck pain and neck stiffness.  Skin:  Negative for color change, pallor, rash and wound.  Allergic/Immunologic: Positive for environmental allergies. Negative for food allergies.  Neurological:  Positive for weakness. Negative for dizziness, tremors, seizures, syncope, facial asymmetry, speech difficulty, light-headedness, numbness and headaches.  Hematological:  Negative for adenopathy. Does not bruise/bleed easily.  Psychiatric/Behavioral:  Positive for sleep disturbance. Negative for agitation and confusion.        Objective:   Physical Exam Vitals and nursing note reviewed.  Constitutional:      General: She is awake. She is not in acute distress.    Appearance: Normal appearance. She is well-developed and well-groomed. She is obese. She is not ill-appearing, toxic-appearing or diaphoretic.  HENT:     Head: Normocephalic and atraumatic.     Jaw: There is normal jaw occlusion.     Salivary Glands: Right salivary gland is not diffusely enlarged. Left salivary gland is not diffusely enlarged.     Right Ear: Hearing and external ear normal.     Left Ear: Hearing and external ear normal.     Nose: Nose normal. No congestion or rhinorrhea.     Mouth/Throat:     Lips: Pink. No lesions.     Mouth: Mucous membranes are moist.     Pharynx: Oropharynx is clear.  Eyes:     General: Lids are normal. Vision grossly intact. Gaze aligned appropriately. No visual field deficit or scleral icterus.       Right eye: No discharge.        Left eye: No discharge.     Extraocular Movements: Extraocular movements intact.     Conjunctiva/sclera: Conjunctivae normal.     Pupils: Pupils are equal, round, and reactive to light.  Neck:     Trachea: Trachea normal.  Cardiovascular:     Rate and Rhythm: Normal rate and regular rhythm.     Pulses: Normal pulses.          Radial pulses are 2+ on the right side and 2+ on the left side.  Pulmonary:     Effort: Pulmonary effort is normal. No respiratory distress.     Breath sounds: Normal breath sounds and air entry. No stridor, decreased air movement or transmitted upper airway sounds. No decreased breath sounds or wheezing.     Comments: Spoke full sentences without difficulty; no cough observed in exam room Abdominal:     General: Abdomen is flat.  Musculoskeletal:        General: Swelling and tenderness present. No deformity.     Right elbow: No swelling, deformity, effusion or lacerations. Normal range of motion.     Left elbow: No swelling, deformity, effusion or  lacerations. Normal range of motion.     Right hand: No swelling, deformity or lacerations. Normal range of motion. Normal strength.     Left hand: No swelling, deformity or lacerations. Normal range of motion. Normal strength.     Cervical back: Normal range of motion and neck supple. No swelling, edema, deformity, erythema, signs of trauma, lacerations, rigidity or crepitus. No pain with movement. Normal range of motion.     Thoracic back: No swelling, edema, deformity, signs of trauma or lacerations. Normal range of motion.     Right upper leg: No swelling, edema, deformity, lacerations, tenderness or bony tenderness.     Left upper leg: Tenderness present. No swelling, edema, deformity, lacerations or bony tenderness.     Right knee: No swelling, deformity, effusion, erythema, ecchymosis, lacerations, bony tenderness or crepitus. Normal range of motion. No tenderness. No medial joint line, lateral joint line, MCL, LCL, ACL, PCL or patellar tendon tenderness.     Left knee: Swelling and bony tenderness present. No deformity, effusion, erythema, ecchymosis, lacerations or crepitus. Decreased range of motion. Tenderness present over the lateral joint line and LCL. No medial joint line, MCL, ACL, PCL or patellar tendon tenderness. Normal alignment and normal patellar mobility.     Right lower leg: No swelling or tenderness. No edema.     Left lower leg: Tenderness present. No swelling, deformity, lacerations or bony tenderness. No edema.     Right ankle: No swelling, deformity, ecchymosis or lacerations. No tenderness. Normal range of motion.     Left ankle: No swelling, deformity, ecchymosis or lacerations. No tenderness. Normal range of motion.     Left Achilles Tendon: No tenderness or defects.       Legs:     Comments: Negative lachmann's test/ant/posterior drawer test/valgus/varus stress test bilaterally; patient able to fully extend to 180 and flex to 100 degrees bilaterally; no patellar  apprehension or TTP bilaterally; knee diameter 16 inches left fitted and distributed large neoprene knee sleeve from clinic stock   Lymphadenopathy:     Head:     Right side of head: No submandibular or preauricular adenopathy.     Left side of head: No submandibular or preauricular adenopathy.     Cervical: No cervical adenopathy.     Right cervical: No superficial cervical adenopathy.    Left cervical: No superficial cervical adenopathy.  Skin:    General: Skin is warm and dry.     Capillary Refill: Capillary refill takes less than 2 seconds.     Coloration: Skin is not ashen, cyanotic, jaundiced, mottled, pale or sallow.     Findings: No abrasion, abscess, acne, bruising, burn, ecchymosis, erythema, signs of injury, laceration, lesion, petechiae, rash or wound. Rash is not crusting, macular, nodular, papular, purpuric, pustular, scaling, urticarial or vesicular.     Nails: There is no clubbing.  Neurological:  General: No focal deficit present.     Mental Status: She is alert and oriented to person, place, and time. Mental status is at baseline.     GCS: GCS eye subscore is 4. GCS verbal subscore is 5. GCS motor subscore is 6.     Cranial Nerves: Cranial nerves are intact. No cranial nerve deficit, dysarthria or facial asymmetry.     Sensory: Sensation is intact. No sensory deficit.     Motor: Motor function is intact. No weakness, tremor, atrophy, abnormal muscle tone or seizure activity.     Coordination: Coordination is intact. Coordination normal.     Gait: Gait is intact. Gait normal.     Comments: In/out of chair without difficulty; gait sure and steady in clinic; bilateral hand grasp equal 5/5  Psychiatric:        Attention and Perception: Attention and perception normal.        Mood and Affect: Mood and affect normal.        Speech: Speech normal.        Behavior: Behavior normal. Behavior is cooperative.        Thought Content: Thought content normal.        Cognition and  Memory: Cognition and memory normal.        Judgment: Judgment normal.          Assessment & Plan:   A-left lateral knee pain acute initial visit; strain of gastrocnemius muscle left initial visit  P-Patient was instructed to decrease walking/kneeling/squatting/ladders/stairs this week and avoid running/jumping/squats and lunges until symptoms improved. Discussed she has altered gait with knee pain and straining supporting structures in calf/IT band.  Given ice pack reusable and cover from clinic stock.  Fitted and distributed size large left neoprene knee sleeve from clinic stock.  Elevated legs when sitting. Naprosyn/Naproxen/aleve 220-440mg  po BID prn pain take at least daily with food x 2 weeks (OTC)  Discussed with patient I can write rx for 500mg  po BID if she prefers when OTC runs out at home.   Medications as directed. Patient may take NSAIDS as needed avoid  advil,  ibuprofen, motrin or diclofenac OTC when taking aleve. Call or return to clinic as needed if these symptoms worsen or fail to improve as anticipated.  Follow up re-evaluation in 2 weeks or sooner prn worsening.  If patient requires work restrictions will need appt with orthopedics or [facility].  Patient to see RN to schedule.  [facility] handouts gastrocenemius strain/rehab, acute knee pain/LCL strain/rehab exercises, IT band syndrome and rehab exercises printed and given to patient.  Gentle AROM and wearing knee neoprene sleeve while active.  Do not need to wear knee support while sleeping.  May hand wash knee support.  Do not put in washer or dryer.  May use blow dryer to speed drying.  Cryotherapy 15 minutes QID prn pain/swelling.  Patient will need to follow up with [facility] if new work restrictions required as I am unable to write or release patient from restrictions in this clinic due to contract limitations. Patient verbalized agreement and understanding of treatment plan and had no further questions at this time.    P2: ROM exercises, Stretching, and Hand outs given

## 2021-06-05 NOTE — Patient Instructions (Addendum)
Lateral Collateral Knee Ligament Sprain The lateral collateral ligament (LCL) is a tough band of tissue that connects the thigh bone to the smaller of the lower leg bones. It is located on the outer side of the knee and helps keep the knee stable. An LCL sprain is a stretch or tear in the LCL. What are the causes? This condition may be caused by: A hard, direct hit (trauma) to the outer side of your knee. Running and changing directions quickly (cutting). Twisting your knee forcefully. What increases the risk? The following factors may make you more likely to develop this condition: Playing contact sports that involve cutting, such as football or soccer. Participating in sports in which there is a risk of twisting the knee, like skiing or wrestling. Having weak muscles. Having poor coordination. What are the signs or symptoms? Symptoms of this condition include: Pain on the outside of the knee. A popping sound at the time of injury. Swelling along the outside of the knee. Bruising around the knee. Feeling unstable when you stand, like your knee will give way. Difficulty walking on uneven surfaces. Numbness at the knee. How is this diagnosed? This condition may be diagnosed based on: Your medical history. A physical exam. Imaging tests, such as an X-ray or MRI. During your physical exam, your health care provider will feel the side of your knee and check for stability by moving it. How is this treated? This condition may be treated by: Resting the knee by keeping weight off of it until swelling and pain improve. Raising (elevating) the knee above the level of your heart. This helps to reduce swelling. Icing the knee. This helps to reduce swelling. Taking an NSAID, like ibuprofen. This helps to reduce pain and swelling. Using a knee brace and crutches while the injury heals. Using a knee brace when participating in athletic activities. Doing rehab exercises (physical  therapy). Surgery. This may be needed if: Your LCL tore all the way through. Your knee is unstable. Your knee is not getting better with other treatments. Follow these instructions at home: If you have a brace: Wear it as told by your health care provider. Remove it only as told by your health care provider. Loosen the brace if your toes tingle, become numb, or turn cold and blue. Keep the brace clean. If the brace is not waterproof: Do not let it get wet. Cover it with a watertight covering when you take a bath or shower. Managing pain, stiffness, and swelling  If directed, put ice on the outer side of your knee. If you have a removable brace, remove it as told by your health care provider. Put ice in a plastic bag. Place a towel between your skin and the bag. Leave the ice on for 20 minutes, 2-3 times a day. Move your foot and toes often to reduce stiffness and swelling. Elevate your knee above the level of your heart while you are sitting or lying down. Medicines Take over-the-counter and prescription medicines only as told by your health care provider. Ask your health care provider if the medicine prescribed to you: Requires you to avoid driving or using heavy machinery. Can cause constipation. You may need to take actions to prevent or treat constipation, such as: Drink enough fluid to keep your urine pale yellow. Take over-the-counter or prescription medicines. Eat foods that are high in fiber, such as beans, whole grains, and fresh fruits and vegetables. Limit foods that are high in fat and processed sugars,  such as fried or sweet foods. Activity Ask your health care provider when it is safe to drive if you have a brace on your leg. Return to your normal activities as told by your health care provider. Ask your health care provider what activities are safe for you. Do exercises as told by your health care provider. Do not use the injured leg to support your body weight until  your health care provider says that you can. Use crutches and a brace as told by your health care provider. General instructions Do not use any products that contain nicotine or tobacco, such as cigarettes, e-cigarettes, and chewing tobacco. These can delay healing. If you need help quitting, ask your health care provider. Keep all follow-up visits as told by your health care provider. This is important. How is this prevented? Warm up and stretch before being active. Cool down and stretch after being active. Give your body time to rest between periods of activity. Make sure to use equipment that fits you. Be safe and responsible while being active. This will help you avoid falls. Do at least 150 minutes of moderate-intensity exercise each week, such as brisk walking or water aerobics. Maintain physical fitness, including: Strength. Flexibility. Cardiovascular fitness. Endurance. Contact a health care provider if: You continue to have pain and swelling for 2-4 weeks. Your symptoms get worse. Your knee feels unstable or gives way. Summary The lateral collateral ligament (LCL) is a tough band of tissue that connects the thigh bone to the smaller of the lower leg bones. An LCL sprain is a stretch or tear in the LCL. This condition may be treated in several ways, including resting your knee, wearing a brace, taking medicines, and having surgery. Do not use the injured leg to support your body weight until your health care provider says that you can. Use crutches and a brace as told by your health care provider. Contact a health care provider if your symptoms do not improve in 2-4 weeks or your symptoms get worse. Keep all follow-up visits as told by your health care provider. This is important. This information is not intended to replace advice given to you by your health care provider. Make sure you discuss any questions you have with your health care provider. Document Revised: 05/11/2018  Document Reviewed: 05/11/2018 [employer] Patient Education  2022 [employer] Knee Sprain, Adult A knee sprain is a stretch or tear in a knee ligament. Knee ligaments are tissues that connect bones in the knee to each other. What are the causes? This condition often results from: A fall. An injury to the knee. What are the signs or symptoms? Symptoms of this condition include: Trouble straightening or bending the leg. Swelling in the knee. Bruising around the knee. Tenderness or pain in the knee. Muscle spasms around the knee. How is this diagnosed? This condition may be diagnosed based on: A physical exam. A history of what happened just before you started to have symptoms. Tests, including: An X-ray. This may be done to make sure no bones are broken. An MRI. This may be done to check if the ligament is torn. Stress testing of the knee. This may be done to check ligament damage. How is this treated? Treatment for this condition may involve: Keeping the knee still (immobilized) with a cast, brace, or splint. Applying ice to the knee. This helps with pain and swelling. Raising (elevating) the knee above the level of your heart when you are resting. This helps with  pain and swelling. Taking medicine for pain. Doing exercises to prevent or limit permanent weakness or stiffness in your knee. Having surgery to reconnect the ligament to the bone or to reconstruct it. This may be needed if the ligament is completely torn. Follow these instructions at home: If you have a splint or brace: Wear it as told by your health care provider. Remove it only as told by your health care provider. Check the skin around it every day. Tell your health care provider about any concerns. Loosen it if your toes tingle, become numb, or turn cold and blue. Keep it clean and dry. If you have a cast: Do not stick anything inside it to scratch your skin. Doing that increases your risk of infection. Check the  skin around it every day. Tell your health care provider about any concerns. You may put lotion on dry skin around the edges of the cast. Do not put lotion on the skin underneath the cast. Keep it clean and dry. Bathing If you have a splint, brace, or cast that is not waterproof: Do not let it get wet. Cover it with a watertight covering when you take a bath or a shower. Managing pain, stiffness, and swelling  If directed, put ice on the injured area. To do this: If you have a removable splint or brace, remove it as told by your health care provider. Put ice in a plastic bag. Place a towel between your skin and the bag or between your cast and the bag. Leave the ice on for 20 minutes, 2-3 times a day. Move your toes often to reduce stiffness and swelling. Elevate the injured area above the level of your heart while you are sitting or lying down. General instructions Take over-the-counter and prescription medicines only as told by your health care provider. Do not use any products that contain nicotine or tobacco, such as cigarettes, e-cigarettes, and chewing tobacco. These can delay healing. If you need help quitting, ask your health care provider. Do exercises as told by your health care provider. Keep all follow-up visits as told by your health care provider. This is important. Contact a health care provider if: You have pain that gets worse. The cast, brace, or splint does not fit right. The cast, brace, or splint gets damaged. Get help right away if: You cannot use your injured knee to support any of your body weight (cannot bear weight). You cannot move the injured joint. You cannot walk more than a few steps without pain or without your knee buckling. You have significant pain, swelling, or numbness in the leg below the cast, brace, or splint. Your foot or toes are numb, cold, or blue after loosening your splint or brace. Summary A knee sprain is a stretch or tear in a knee  ligament that usually occurs as the result of a fall or injury. Treatment may involve immobilizing the knee with a cast, splint, or brace and then doing exercises. If the ligament is completely torn, it may require surgery to repair or replace the injured ligament. This information is not intended to replace advice given to you by your health care provider. Make sure you discuss any questions you have with your health care provider. Document Revised: 08/11/2019 Document Reviewed: 08/11/2019 [employer] Patient Education  2022 [employer] Medial Head Gastrocnemius Tear Rehab Ask your health care provider which exercises are safe for you. Do exercises exactly as told by your health care provider and adjust them as directed.  It is normal to feel mild stretching, pulling, tightness, or discomfort as you do these exercises. Stop right away if you feel sudden pain or your pain gets worse. Do not begin these exercises until told by your health care provider. Stretching and range-of-motion exercises These exercises warm up your muscles and joints and improve the movement and flexibility of your lower leg. These exercises also help to relieve pain and stiffness. Gastrocnemius stretch This exercise is also called a calf stretch. It stretches the muscles in the back of the lower leg (gastrocnemius). Sit with your left / right leg extended. Loop a belt or towel around the ball of your left / right foot. The ball of your foot is on the walking surface, right under your toes. Hold both ends of the belt or towel. Keep your left / right ankle and foot relaxed and keep your knee straight while you use the belt or towel to pull your foot and ankle toward you. Stop at the first point of resistance. Hold this position for _____5_____ seconds. Repeat ___3_______ times. Complete this exercise ____2______ times a day. Ankle alphabet  Sit with your left / right leg supported at the lower leg. Do not rest your foot on  anything. Make sure your foot has room to move freely. Think of your left / right foot as a paintbrush. Move your foot to trace each letter of the alphabet in the air. Keep your hip and knee still while you trace. Make the letters as large as you can without feeling discomfort. Trace every letter of the alphabet. Repeat ___3_______ times. Complete this exercise ______2____ times a day. Strengthening exercises These exercises build strength and endurance in your lower leg. Endurance is the ability to use your muscles for a long time, even after they get tired. Plantar flexion with band, seated  Sit on the floor with your left / right leg extended. Loop a rubber exercise band or tube around the ball of your left / right foot. The ball of your foot is on the walking surface, right under your toes. The band or tube should be slightly tense when your foot is relaxed. If the band or tube slips, you can put on your shoe or put a washcloth between the band and your foot to help it stay in place. While holding both ends of the band or tube, slowly point your toes downward, pushing them away from you (plantar flexion). Hold this position for _____5_____ seconds. Slowly release the tension in the band or tube, controlling smoothly until your foot is back to the starting position. Repeat ______3____ times. Complete this exercise ____2______ times a day. Plantar flexion, standing  Stand with your feet shoulder-width apart. Place your hands on a wall or table to steady yourself as needed, but try not to use it for support. Rise up on your toes (plantar flexion). If this exercise is too easy, try these options: Shift your weight toward your left / right leg until you feel challenged. If told by your health care provider, stand on your left / right foot only. Hold this position for ______5____ seconds. Repeat ____3______ times. Complete this exercise ______2____ times a day. Eccentric plantar  flexion  Stand on the balls of your feet on the edge of a step. The ball of your foot is on the walking surface, right under your toes. Do not put your heels on the step. For balance, rest your hands on the wall or on a railing. Try not  to lean on it for support. Rise up onto the balls of your feet, using both legs to help. Keeping your heels up, slowly shift all of your weight to your left / right foot and lift your other foot off the step. Slowly lower your left / right heel so it drops below the level of the step. Lowering your heel under tension is called eccentric plantar flexion. You will feel a slight stretch in your left / right calf. Put your other foot back onto the step before returning to the start position. Repeat ____3______ times. Complete this exercise _____2_____ times a day. This information is not intended to replace advice given to you by your health care provider. Make sure you discuss any questions you have with your health care provider. Document Revised: 07/19/2019 Document Reviewed: 08/01/2018 [employer] Patient Education  2022 [employer] Medial Head Gastrocnemius Tear Medial head gastrocnemius tear, also called tennis leg, is an injury to the inner part of the calf muscle. This injury may include overstretching of the muscle or a partial or complete tear. This is a common sports injury. Calf muscle tears usually occur near the back of the knee. This often causes sudden pain and muscle weakness. What are the causes? This condition is caused by forceful stretching or strain on the calf muscle. This usually happens when you forcefully push off of your foot. It may also happen if you forcefully straighten your knee while your foot is flat on the ground. What increases the risk? The following factors may make you more likely to develop this condition: Being female and older than age 63. Playing sports that involve: Quick increases in speed and changes of direction, such as  tennis and soccer. Jumping, such as basketball. Running, especially uphill or on uneven ground. What are the signs or symptoms? Symptoms of this condition include: Sudden pain in the back of the leg. You may hear a noise, like a pop or a snap at the time of injury. Pain that gets worse when you bring your toes up toward your shin or when you straighten your knee. Pain on the inside of your calf, from your knee to your ankle. Pain when pressing on your calf muscle. Swelling and bruising along your calf and lower leg, down to your ankle. This may worsen for the first 2 days before getting better. Not being able to rise up on your toes. Difficulty pushing off your foot when walking or using stairs. How is this diagnosed? This condition may be diagnosed based on: Your symptoms and medical history. A physical exam. Your health care provider may be able to feel a lump or a defect in your muscle. An MRI or ultrasound to determine the severity and exact location of your injury. How is this treated? Treatment for this condition may include: Resting the muscle and keeping weight off your leg for several days. During this time, you may use crutches or another walking device. Using a splint to keep your ankle or knee in a stable position. Wearing a walking boot to decrease the use of your gastrocnemius muscle. Using a wedge under your heel to reduce stretching of your healing muscle. Wearing a compression sleeve around your calf muscle. Icing the muscle. Raising (elevating) your leg when resting. Taking medicine for pain and swelling, such as NSAIDs or steroids. Taking medicine for muscle spasms. Doing leg exercises as told by your health care provider or physical therapist. Follow these instructions at home: Medicines Take over-the-counter and  prescription medicines only as told by your health care provider. Ask your health care provider if the medicine prescribed to you requires you to avoid  driving or using heavy machinery. Talk with your health care provider before you take any medicines that contain aspirin. Aspirin increases your risk for bleeding at the injured area. If you have a splint, boot, or compression sleeve: Wear it as told by your health care provider. Remove it only as told by your health care provider. Loosen the splint, boot, or sleeve if your toes tingle, become numb, or turn cold and blue. Keep the splint, boot, or sleeve clean. If the splint, boot, or sleeve is not waterproof: Do not let it get wet. Cover it with a watertight covering when you take a bath or shower. Managing pain, stiffness, and swelling  If directed, put ice on the injured area. If you have a removable splint, boot, or sleeve, remove it as told by your health care provider. Put ice in a plastic bag. Place a towel between your skin and the bag. Leave the ice on for 20 minutes, 2-3 times a day. Move your toes often to reduce stiffness and swelling. Elevate the injured area above the level of your heart while you are sitting or lying down. Activity Return gradually to your normal activities as told by your health care provider. Ask your health care provider what activities are safe for you. Do not use the injured limb to support your body weight until your health care provider says that you can. Use crutches as told by your health care provider. Do exercises as told by your health care provider. Return to sporting activity only as told by your health care provider or physical therapist. Full recovery may take several months. General instructions Ask your health care provider when it is safe to drive. Do not use any products that contain nicotine or tobacco, such as cigarettes, e-cigarettes, and chewing tobacco. If you need help quitting, ask your health care provider. Keep all follow-up visits as told by your health care provider. This is important. How is this prevented? Warm up and  stretch before being active. Cool down and stretch after being active. Give your body time to rest between periods of activity. Make sure to use equipment that fits you. Be safe and responsible while being active to avoid falls. Maintain physical fitness, including: Strength. Flexibility. Contact a health care provider if: Your symptoms do not improve with rest and treatment. Get help right away if: You have swelling or redness in your calf that is getting worse. Your skin or toenails turn blue or gray, feel cold, or become numb. Summary Medial head gastrocnemius tear, also called tennis leg, is an injury to the inner part of the calf muscle. Follow instructions as told by your health care provider for resting, icing, compressing, and elevating your leg. Take over-the-counter and prescription medicines only as told by your health care provider. Contact a health care provider if your symptoms do not improve with rest and treatment. This information is not intended to replace advice given to you by your health care provider. Make sure you discuss any questions you have with your health care provider. Document Revised: 08/17/2018 Document Reviewed: 08/17/2018 [employer] Patient Education  2022 [employer] Iliotibial Band Syndrome Rehab Ask your health care provider which exercises are safe for you. Do exercises exactly as told by your health care provider and adjust them as directed. It is normal to feel mild stretching, pulling,  tightness, or discomfort as you do these exercises. Stop right away if you feel sudden pain or your pain gets significantly worse. Do not begin these exercises until told by your health care provider. Stretching and range-of-motion exercises These exercises warm up your muscles and joints and improve the movement and flexibility of your hip and pelvis. Quadriceps stretch, prone  Lie on your abdomen (prone position) on a firm surface, such as a bed or padded  floor. Bend your left / right knee and reach back to hold your ankle or pant leg. If you cannot reach your ankle or pant leg, loop a belt around your foot and grab the belt instead. Gently pull your heel toward your buttocks. Your knee should not slide out to the side. You should feel a stretch in the front of your thigh and knee (quadriceps). Hold this position for ___5_______ seconds. Repeat ____3______ times. Complete this exercise _____2_____ times a day. Iliotibial band stretch An iliotibial band is a strong band of muscle tissue that runs from the outer side of your hip to the outer side of your thigh and knee. Lie on your side with your left / right leg in the top position. Bend both of your knees and grab your left / right ankle. Stretch out your bottom arm to help you balance. Slowly bring your top knee back so your thigh goes behind your trunk. Slowly lower your top leg toward the floor until you feel a gentle stretch on the outside of your left / right hip and thigh. If you do not feel a stretch and your knee will not fall farther, place the heel of your other foot on top of your knee and pull your knee down toward the floor with your foot. Hold this position for ______5____ seconds. Repeat ___3_______ times. Complete this exercise ___2______ times a day. Strengthening exercises These exercises build strength and endurance in your hip and pelvis. Endurance is the ability to use your muscles for a long time, even after they get tired. Straight leg raises, side-lying This exercise strengthens the muscles that rotate the leg at the hip and move it away from your body (hip abductors). Lie on your side with your left / right leg in the top position. Lie so your head, shoulder, hip, and knee line up. You may bend your bottom knee to help you balance. Roll your hips slightly forward so your hips are stacked directly over each other and your left / right knee is facing forward. Tense the  muscles in your outer thigh and lift your top leg 4-6 inches (10-15 cm). Hold this position for ____5______ seconds. Slowly lower your leg to return to the starting position. Let your muscles relax completely before doing another repetition. Repeat _____3_____ times. Complete this exercise _____2_____ times a day. Leg raises, prone This exercise strengthens the muscles that move the hips backward (hip extensors). Lie on your abdomen (prone position) on your bed or a firm surface. You can put a pillow under your hips if that is more comfortable for your lower back. Bend your left / right knee so your foot is straight up in the air. Squeeze your buttocks muscles and lift your left / right thigh off the bed. Do not let your back arch. Tense your thigh muscle as hard as you can without increasing any knee pain. Hold this position for ___5_______ seconds. Slowly lower your leg to return to the starting position and allow it to relax completely. Repeat _____3_____  times. Complete this exercise ____2______ times a day. Hip hike Stand sideways on a bottom step. Stand on your left / right leg with your other foot unsupported next to the step. You can hold on to a railing or wall for balance if needed. Keep your knees straight and your torso square. Then lift your left / right hip up toward the ceiling. Slowly let your left / right hip lower toward the floor, past the starting position. Your foot should get closer to the floor. Do not lean or bend your knees. Repeat ____3______ times. Complete this exercise _____2_____ times a day. This information is not intended to replace advice given to you by your health care provider. Make sure you discuss any questions you have with your health care provider. Document Revised: 11/29/2019 Document Reviewed: 11/29/2019 Elsevier Patient Education  2022 [employer] Iliotibial Band Syndrome Iliotibial band syndrome is a condition that often causes knee pain. It can  also cause pain in the outside of the hip, thigh, and knee. The iliotibial band is a strip of tissue in each of the legs. This band runs from the outside of the hip and down the thigh to the outside of the knee. Repeatedly bending and straightening your knee can irritate your iliotibial band. What are the causes? This condition is caused by inflammation from rubbing (friction) of the iliotibial band as it moves over the thigh bone (femur) when you bend and straighten your knee again and again. What increases the risk? You are more likely to develop this condition if: You change elevation often while running on a treadmill. You run very long distances. You recently increased the length or intensity of your workouts. Intensity means how much effort you put in. You run downhill often, or you just started running downhill. You ride a bike very far or often. You may also be at greater risk if: You start a new workout routine without first warming up your muscles. You have a job that requires you to bend, squat, or climb often. What are the signs or symptoms? Symptoms of this condition include: Pain along the outside of your knee that may be worse with activity, especially running or going up and down stairs. A feeling like a snap over your knee or hip. Swelling on the outside of your knee. Pain or a feeling of tightness in your hip. How is this diagnosed? This condition is diagnosed based on: Your symptoms. Your medical history. A physical exam. You may also see a health care provider who specializes in reducing pain and improving movement (physical therapist). A physical therapist may do an exam to check your balance, movement, and way of walking or running (gait) to see whether the way you move could add to your injury. You may also have tests to measure your strength, flexibility, and range of motion. How is this treated? This condition may be treated by: Taking NSAIDs, such as ibuprofen,  to help relieve pain and swelling. Resting and limiting exercise. Returning to activities gradually. Doing exercises to improve movement and strength (physical therapy) as told by your health care provider. Having an injection of steroid medicine. This is medicine that helps relieve inflammation. Having surgery. This may be done if your symptoms do not improve after other treatments. Follow these instructions at home: Managing pain, stiffness, and swelling If directed, put ice on the injured area. To do this: Put ice in a plastic bag. Place a towel between your skin and the bag. Leave the  ice on for 20 minutes, 2-3 times a day. Remove the ice if your skin turns bright red. This is very important. If you cannot feel pain, heat, or cold, you have a greater risk of damage to the area.  Activity Return to your normal activities as told by your health care provider. Ask your health care provider what activities are safe for you. Include low-impact activities, such as swimming, in your exercise routine. Check with your health care provider to make sure running is safe for you. Do exercises as told by your health care provider. General instructions Take over-the-counter and prescription medicines only as told by your health care provider. Make sure you wear shoes that fit well and have good cushioning and arch support. Keep all follow-up visits. This is important. How is this prevented? Warm up and stretch before being active. Cool down and stretch after being active. Give your body time to rest between periods of activity. Consider getting help from a coach or trainer to come up with a safe running plan and a plan to advance (progress) your training that fits your goals and ability. Change directions often while running around a track. Replace your shoes when the soles are worn out. Maintain physical fitness. This includes strength and flexibility. Contact a health care provider if: Your  pain does not improve. Your pain gets worse even with treatment. Summary Iliotibial band syndrome is a condition that often causes knee pain. It can also cause pain in the outside of your hip, thigh, and knee. Treatment includes taking NSAIDs, resting, returning to activities gradually, and doing physical therapy exercises. Return to your normal activities as told by your health care provider. Ask your health care provider what activities are safe for you. This information is not intended to replace advice given to you by your health care provider. Make sure you discuss any questions you have with your health care provider. Document Revised: 01/22/2020 Document Reviewed: 01/22/2020 Elsevier Patient Education  2022 [employer].

## 2021-06-17 ENCOUNTER — Other Ambulatory Visit: Payer: Self-pay

## 2021-06-17 ENCOUNTER — Ambulatory Visit: Payer: Self-pay | Admitting: Registered Nurse

## 2021-06-17 VITALS — BP 142/95 | HR 67 | Temp 97.9°F

## 2021-06-17 DIAGNOSIS — M25562 Pain in left knee: Secondary | ICD-10-CM

## 2021-06-17 MED ORDER — FLUTICASONE PROPIONATE 50 MCG/ACT NA SUSP: 1.0000 | Freq: Every day | NASAL | 1 refills | Status: AC | PRN

## 2021-06-17 MED ORDER — SALINE SPRAY 0.65 % NA SOLN: 2.0000 | NASAL | 0 refills | Status: AC | PRN

## 2021-06-17 NOTE — Patient Instructions (Signed)
Acute Knee Pain, Adult Acute knee pain is sudden and may be caused by damage, swelling, or irritation of the muscles and tissues that support the knee. Pain may result from: A fall. An injury to the knee from twisting motions. A hit to the knee. Infection. Acute knee pain may go away on its own with time and rest. If it does not, your health care provider may order tests to find the cause of the pain. These may include: Imaging tests, such as an X-ray, MRI, CT scan, or ultrasound. Joint aspiration. In this test, fluid is removed from the knee and evaluated. Arthroscopy. In this test, a lighted tube is inserted into the knee and an image is projected onto a TV screen. Biopsy. In this test, a sample of tissue is removed from the body and studied under a microscope. Follow these instructions at home: If you have a knee sleeve or brace:  Wear the knee sleeve or brace as told by your health care provider. Remove it only as told by your health care provider. Loosen it if your toes tingle, become numb, or turn cold and blue. Keep it clean. If the knee sleeve or brace is not waterproof: Do not let it get wet. Cover it with a watertight covering when you take a bath or shower.  Activity Rest your knee. Do not do things that cause pain or make pain worse. Avoid high-impact activities or exercises, such as running, jumping rope, or doing jumping jacks. Work with a physical therapist to make a safe exercise program, as recommended by your health care provider. Do exercises as told by your physical therapist. Managing pain, stiffness, and swelling  If directed, put ice on the affected knee. To do this: If you have a removable knee sleeve or brace, remove it as told by your health care provider. Put ice in a plastic bag. Place a towel between your skin and the bag. Leave the ice on for 20 minutes, 2-3 times a day. Remove the ice if your skin turns bright red. This is very important. If you cannot  feel pain, heat, or cold, you have a greater risk of damage to the area. If directed, use an elastic bandage to put pressure (compression) on your injured knee. This may control swelling, give support, and help with discomfort. Raise (elevate) your knee above the level of your heart while you are sitting or lying down. Sleep with a pillow under your knee.  General instructions Take over-the-counter and prescription medicines only as told by your health care provider. Do not use any products that contain nicotine or tobacco, such as cigarettes, e-cigarettes, and chewing tobacco. If you need help quitting, ask your health care provider. If you are overweight, work with your health care provider and a dietitian to set a weight-loss goal that is healthy and reasonable for you. Extra weight can put pressure on your knee. Pay attention to any changes in your symptoms. Keep all follow-up visits. This is important. Contact a health care provider if: Your knee pain continues, changes, or gets worse. You have a fever along with knee pain. Your knee feels warm to the touch or is red. Your knee buckles or locks up. Get help right away if: Your knee swells, and the swelling becomes worse. You cannot move your knee. You have severe pain in your knee that cannot be managed with pain medicine. Summary Acute knee pain can be caused by a fall, an injury, an infection, or damage, swelling,   or irritation of the tissues that support your knee. Your health care provider may perform tests to find out the cause of the pain. Pay attention to any changes in your symptoms. Relieve your pain with rest, medicines, light activity, and the use of ice. Get help right away if your knee swells, you cannot move your knee, or you have severe pain that cannot be managed with medicine. This information is not intended to replace advice given to you by your health care provider. Make sure you discuss any questions you have with  your healthcare provider. Document Revised: 03/06/2020 Document Reviewed: 03/06/2020 Elsevier Patient Education  2022 Elsevier Inc.  

## 2021-06-17 NOTE — Progress Notes (Signed)
Subjective:    Patient ID: Ellen Fischer, female    DOB: [birth year], 55 y.o.   MRN: [id number]  54y/o Caucasian established female pt presents for f/u L knee pain last evaluation 06/05/21.  Patient reported today not wearing knee sleeve but it did help.  Only had to wear it over pants one day as felt it was digging into her skin.  Less pain and feeling stronger/improving daily.  States pain deep in knee now cannot touch/palpate to work pain.  Today the first day without sleeve at work.  Did not wear at home on her days off work/weekends.  Denied redness/swelling/tenderness/giving out.  Has been standing at work today and noticing some soreness.  Notices some weakness with squatting/kneeling to stand up.     Review of Systems  Constitutional:  Negative for activity change, appetite change, chills, diaphoresis, fatigue and fever.  HENT:  Negative for trouble swallowing and voice change.   Eyes:  Negative for photophobia and visual disturbance.  Respiratory:  Negative for cough, shortness of breath, wheezing and stridor.   Cardiovascular:  Negative for leg swelling.  Gastrointestinal:  Negative for diarrhea and vomiting.  Endocrine: Negative for cold intolerance and heat intolerance.  Genitourinary:  Negative for difficulty urinating.  Musculoskeletal:  Negative for back pain, gait problem, joint swelling, neck pain and neck stiffness.  Skin:  Negative for color change, pallor, rash and wound.  Allergic/Immunologic: Positive for environmental allergies. Negative for food allergies.  Neurological:  Positive for weakness. Negative for dizziness, tremors, seizures, syncope, facial asymmetry, speech difficulty, light-headedness, numbness and headaches.  Hematological:  Negative for adenopathy. Does not bruise/bleed easily.  Psychiatric/Behavioral:  Negative for agitation, confusion and sleep disturbance.       Objective:   Physical Exam Vitals and nursing note reviewed.  Constitutional:       General: She is awake. She is not in acute distress.    Appearance: Normal appearance. She is well-developed and well-groomed. She is obese. She is not ill-appearing, toxic-appearing or diaphoretic.  HENT:     Head: Normocephalic and atraumatic.     Jaw: There is normal jaw occlusion.     Salivary Glands: Right salivary gland is not diffusely enlarged. Left salivary gland is not diffusely enlarged.     Right Ear: Hearing and external ear normal.     Left Ear: Hearing and external ear normal.     Nose: Nose normal. No congestion or rhinorrhea.     Mouth/Throat:     Lips: Pink. No lesions.     Mouth: Mucous membranes are moist. No injury, lacerations, oral lesions or angioedema.     Pharynx: Oropharynx is clear.  Eyes:     General: Lids are normal. Vision grossly intact. Gaze aligned appropriately. No allergic shiner, visual field deficit or scleral icterus.       Right eye: No discharge.        Left eye: No discharge.     Extraocular Movements: Extraocular movements intact.     Conjunctiva/sclera: Conjunctivae normal.     Pupils: Pupils are equal, round, and reactive to light.  Neck:     Trachea: Trachea and phonation normal. No tracheal deviation.  Cardiovascular:     Rate and Rhythm: Normal rate and regular rhythm.     Pulses: Normal pulses.          Radial pulses are 2+ on the right side and 2+ on the left side.  Pulmonary:     Effort: Pulmonary effort is  normal. No respiratory distress.     Breath sounds: Normal breath sounds and air entry. No stridor or transmitted upper airway sounds. No wheezing.     Comments: Spoke full sentences without difficulty; no cough observed in exam room Abdominal:     General: Abdomen is flat.  Musculoskeletal:        General: No swelling, tenderness, deformity or signs of injury. Normal range of motion.     Right shoulder: No swelling, deformity, effusion or laceration.     Left shoulder: No swelling, deformity, effusion or laceration.      Right elbow: No swelling, deformity, effusion or lacerations. Normal range of motion.     Left elbow: No swelling, deformity, effusion or lacerations. Normal range of motion.     Right hand: No swelling, deformity or lacerations. Normal range of motion. Normal strength.     Left hand: No swelling, deformity or lacerations. Normal range of motion. Normal strength.     Cervical back: Normal range of motion and neck supple. No swelling, edema, deformity, erythema, signs of trauma, lacerations, rigidity or crepitus. No pain with movement. Normal range of motion.     Thoracic back: No swelling, edema, deformity, signs of trauma or lacerations. Normal range of motion.     Lumbar back: No swelling, edema, deformity, signs of trauma or lacerations. Normal range of motion.     Right knee: No swelling, deformity, effusion, erythema, ecchymosis, lacerations, bony tenderness or crepitus. Normal range of motion. No tenderness. No LCL laxity, MCL laxity, ACL laxity or PCL laxity. Normal patellar mobility. Normal pulse.     Instability Tests: Anterior drawer test negative. Posterior drawer test negative. Anterior Lachman test negative.     Left knee: No swelling, deformity, effusion, erythema, ecchymosis, lacerations, bony tenderness or crepitus. Normal range of motion. No tenderness. No LCL laxity, MCL laxity, ACL laxity or PCL laxity.Normal patellar mobility. Normal pulse.     Instability Tests: Anterior drawer test negative. Posterior drawer test negative. Anterior Lachman test negative.     Right lower leg: No swelling, deformity, lacerations or tenderness. No edema.     Left lower leg: No swelling, deformity, lacerations or tenderness. No edema.     Comments: Negative lachmann's/valgus/varus stress/anterior/posterior drawer tests bilaterally; in/out of chair without difficulty; no erythema/ede/a/effusion/ecchymosis/tenderness to palpation; gait sure and steady in clinic/in/out of chair without difficulty   Lymphadenopathy:     Head:     Right side of head: No submandibular or preauricular adenopathy.     Left side of head: No submandibular or preauricular adenopathy.     Cervical: No cervical adenopathy.     Right cervical: No superficial cervical adenopathy.    Left cervical: No superficial cervical adenopathy.  Skin:    General: Skin is warm and dry.     Capillary Refill: Capillary refill takes less than 2 seconds.     Coloration: Skin is not ashen, cyanotic, jaundiced, mottled, pale or sallow.     Findings: No abrasion, abscess, acne, bruising, burn, ecchymosis, erythema, signs of injury, laceration, lesion, petechiae, rash or wound.     Nails: There is no clubbing.  Neurological:     General: No focal deficit present.     Mental Status: She is alert and oriented to person, place, and time. Mental status is at baseline.     GCS: GCS eye subscore is 4. GCS verbal subscore is 5. GCS motor subscore is 6.     Cranial Nerves: Cranial nerves are intact. No cranial  nerve deficit, dysarthria or facial asymmetry.     Sensory: Sensation is intact. No sensory deficit.     Motor: Motor function is intact. No weakness, tremor, atrophy, abnormal muscle tone or seizure activity.     Coordination: Coordination is intact. Coordination normal.     Gait: Gait is intact. Gait normal.     Comments: In/out of chair without difficulty; gait sure and steady in clinic; bilateral hand grasp equal 5/5  Psychiatric:        Attention and Perception: Attention and perception normal.        Mood and Affect: Mood and affect normal.        Speech: Speech normal.        Behavior: Behavior normal. Behavior is cooperative.        Thought Content: Thought content normal.        Cognition and Memory: Cognition and memory normal.        Judgment: Judgment normal.          Assessment & Plan:  A-left knee pain acute subsequent visit  P-Much improved from previous evaluation.  Deep central knee pain  ?meniscus/arthritis related.  Continue neoprene knee sleeve wear at work.  Consider weight loss.  Discussed each pound overweight is 5lbs more pressure on knee.  Avoid weight gain/jumping/running/prolonged squatting/kneeling/repetitive stairs. Elevate legs when sitting. Naprosyn/Naproxen/aleve 220-440mg  po BID prn pain OTC)    Medications as directed. Patient may take NSAIDS as needed avoid  advil,  ibuprofen, motrin or diclofenac OTC when taking aleve. Call or return to clinic as needed if these symptoms worsen or fail to improve as anticipated.  Follow up re-evaluation prn worsening.  If patient requires work restrictions will need appt with orthopedics or [facility].  Patient to see RN Rolly Salter to schedule.  Gentle AROM and wearing knee neoprene sleeve while active.  Do not need to wear knee support while sleeping.  May hand wash knee support.  Do not put in washer or dryer.  May use blow dryer to speed drying.  Cryotherapy 15 minutes QID prn pain/swelling.  Patient will need to follow up with [facility] if new work restrictions required as I am unable to write or release patient from restrictions in this clinic due to contract limitations. Patient verbalized agreement and understanding of treatment plan and had no further questions at this time.

## 2021-07-23 ENCOUNTER — Ambulatory Visit: Payer: No Typology Code available for payment source | Admitting: Nurse Practitioner

## 2021-07-23 ENCOUNTER — Encounter: Payer: Self-pay | Admitting: Nurse Practitioner

## 2021-07-23 ENCOUNTER — Other Ambulatory Visit: Payer: Self-pay

## 2021-07-23 VITALS — BP 150/92 | HR 79 | Temp 97.4°F | Resp 12 | Ht 63.5 in | Wt 196.2 lb

## 2021-07-23 DIAGNOSIS — Z23 Encounter for immunization: Secondary | ICD-10-CM | POA: Diagnosis not present

## 2021-07-23 DIAGNOSIS — I10 Essential (primary) hypertension: Secondary | ICD-10-CM | POA: Diagnosis not present

## 2021-07-23 MED ORDER — LISINOPRIL 5 MG PO TABS: 5.0000 mg | ORAL_TABLET | Freq: Every day | ORAL | 0 refills | Status: DC

## 2021-07-23 NOTE — Assessment & Plan Note (Signed)
Patient has had several elevated readings over the year at different office settings.  Did recheck blood pressure in office still above goal.  Given patient has history of mom with high blood pressure stroke and there was throat we had conversation about treating to help with blood pressure.  Patient was amendable did discuss options and decided on lisinopril.  Did discuss possible side effects inclusive of increase creatinine, hyperkalemia and angioedema.  Patient should be low risk for angioedema.  Print off information sheet about lisinopril and did educate about dry annoying cough that he developed.

## 2021-07-23 NOTE — Progress Notes (Signed)
Established Patient Office Visit  Subjective:  Patient ID: Ellen Fischer, female    DOB: [birth year]  Age: 55 y.o. MRN: [id number]  CC:  Chief Complaint  Patient presents with   Transfer of Care    HPI Ellen Fischer presents for [facility]  Elevated Blood Pressure: No way of checking blood pressure at home. Elevated at work when she has see the occupational nurse. Mentions of it being elevated in previous notes. Patient mother had hypertension and the strokes. Father also had a stroke.  OA of left knee: IT muscle pull in the past from her occupational provider. Was given a bracke and ice pack. Has improved since treatment. Does have a physical job of standing on concrete and going up and down stairs at times.   Past Medical History:  Diagnosis Date   Allergy    Arthritis     No past surgical history on file.  Family History  Problem Relation Age of Onset   Breast cancer Paternal Aunt    Hypertension Mother    Stroke Mother     Social History   Socioeconomic History   Marital status: Divorced    Spouse name: Not on file   Number of children: Not on file   Years of education: Not on file   Highest education level: Not on file  Occupational History   Not on file  Tobacco Use   Smoking status: Never   Smokeless tobacco: Never  Substance and Sexual Activity   Alcohol use: Yes    Comment: rare   Drug use: No   Sexual activity: Never  Other Topics Concern   Not on file  Social History Narrative   Not on file   Social Determinants of Health   Financial Resource Strain: Not on file  Food Insecurity: Not on file  Transportation Needs: Not on file  Physical Activity: Not on file  Stress: Not on file  Social Connections: Not on file  Intimate Partner Violence: Not on file    Outpatient Medications Prior to Visit  Medication Sig Dispense Refill   fluticasone (FLONASE) 50 MCG/ACT nasal spray Place 1 spray into both nostrils daily as needed for  allergies or rhinitis. 16 g 1   sodium chloride (OCEAN) 0.65 % SOLN nasal spray Place 2 sprays into both nostrils as needed for congestion.  0   No facility-administered medications prior to visit.    Allergies  Allergen Reactions   Sulfa Antibiotics     Pt does not remember    ROS Review of Systems  Constitutional:  Negative for chills and fever.  Eyes:  Negative for visual disturbance.  Respiratory:  Negative for cough and shortness of breath.   Cardiovascular:  Negative for chest pain.  Gastrointestinal:  Negative for diarrhea, nausea and vomiting.  Neurological:  Negative for dizziness, light-headedness and headaches.     Objective:    Physical Exam Vitals and nursing note reviewed.  Constitutional:      Appearance: She is obese.  HENT:     Right Ear: Tympanic membrane, ear canal and external ear normal. There is no impacted cerumen.     Left Ear: Tympanic membrane, ear canal and external ear normal. There is no impacted cerumen.     Mouth/Throat:     Mouth: Mucous membranes are moist.  Eyes:     Extraocular Movements: Extraocular movements intact.     Pupils: Pupils are equal, round, and reactive to light.  Neck:  Thyroid: No thyroid mass, thyromegaly or thyroid tenderness.  Cardiovascular:     Rate and Rhythm: Normal rate and regular rhythm.  Pulmonary:     Effort: Pulmonary effort is normal.     Breath sounds: Normal breath sounds.  Abdominal:     General: Bowel sounds are normal.  Musculoskeletal:     Right lower leg: No edema.     Left lower leg: No edema.  Lymphadenopathy:     Cervical: No cervical adenopathy.  Skin:    General: Skin is warm.  Neurological:     Deep Tendon Reflexes:     Reflex Scores:      Bicep reflexes are 2+ on the right side and 2+ on the left side.      Patellar reflexes are 2+ on the right side and 2+ on the left side.    Comments: Bilateral upper and lower extremities 5/5  Psychiatric:        Mood and Affect: Mood normal.         Behavior: Behavior normal.        Thought Content: Thought content normal.        Judgment: Judgment normal.    BP (!) 150/92   Pulse 79   Temp (!) 97.4 F (36.3 C)   Resp 12   Ht 5' 3.5" (1.613 m)   Wt 196 lb 4 oz (89 kg)   LMP  (LMP Unknown) Comment: postmeopausal  SpO2 99%   BMI 34.22 kg/m  Wt Readings from Last 3 Encounters:  07/23/21 196 lb 4 oz (89 kg)  12/17/20 209 lb (94.8 kg)  09/11/19 220 lb (99.8 kg)     Health Maintenance Due  Topic Date Due   Zoster Vaccines- Shingrix (1 of 2) Never done   COVID-19 Vaccine (3 - Booster for Pfizer series) 11/13/2020   INFLUENZA VACCINE  05/05/2021   MAMMOGRAM  07/19/2021    There are no preventive care reminders to display for this patient.  Lab Results  Component Value Date   TSH 4.220 07/06/2017   Lab Results  Component Value Date   WBC 4.6 12/17/2020   HGB 14.7 12/17/2020   HCT 44.7 12/17/2020   MCV 89.6 12/17/2020   PLT 201.0 12/17/2020   Lab Results  Component Value Date   NA 137 12/17/2020   K 4.6 12/17/2020   CO2 27 12/17/2020   GLUCOSE 80 12/17/2020   BUN 20 12/17/2020   CREATININE 1.01 12/17/2020   BILITOT 0.5 12/17/2020   ALKPHOS 94 12/17/2020   AST 18 12/17/2020   ALT 14 12/17/2020   PROT 7.2 12/17/2020   ALBUMIN 4.1 12/17/2020   CALCIUM 9.6 12/17/2020   GFR 62.26 12/17/2020   Lab Results  Component Value Date   CHOL 206 (H) 12/17/2020   Lab Results  Component Value Date   HDL 64.80 12/17/2020   Lab Results  Component Value Date   LDLCALC 125 (H) 12/17/2020   Lab Results  Component Value Date   TRIG 80.0 12/17/2020   Lab Results  Component Value Date   CHOLHDL 3 12/17/2020   Lab Results  Component Value Date   HGBA1C 5.4 12/17/2020      Assessment & Plan:   Problem List Items Addressed This Visit       Cardiovascular and Mediastinum   Primary hypertension    Patient has had several elevated readings over the year at different office settings.  Did recheck  blood pressure in office still above goal.  Given patient has history of mom with high blood pressure stroke and there was throat we had conversation about treating to help with blood pressure.  Patient was amendable did discuss options and decided on lisinopril.  Did discuss possible side effects inclusive of increase creatinine, hyperkalemia and angioedema.  Patient should be low risk for angioedema.  Print off information sheet about lisinopril and did educate about dry annoying cough that he developed.      Relevant Medications   lisinopril (ZESTRIL) 5 MG tablet     Other   Need for influenza vaccination - Primary   Relevant Orders   Flu Vaccine QUAD 6+ mos PF IM (Fluarix Quad PF) (Completed)    No orders of the defined types were placed in this encounter.   Follow-up: Return in about 4 weeks (around 08/20/2021) for For BP check and Lab draw.   This visit occurred during the SARS-CoV-2 public health emergency.  Safety protocols were in place, including screening questions prior to the visit, additional usage of staff PPE, and extensive cleaning of exam room while observing appropriate contact time as indicated for disinfecting solutions.   Audria Nine, NP

## 2021-07-23 NOTE — Patient Instructions (Signed)
Nice to see you Need to see you in a month for a recheck on blood pressure and to check a lab If you can get it checked on occasion at work that would be great. If you can get a cuff for home use and monitor it about 3 times a week

## 2021-08-20 ENCOUNTER — Other Ambulatory Visit: Payer: Self-pay

## 2021-08-20 DIAGNOSIS — I10 Essential (primary) hypertension: Secondary | ICD-10-CM

## 2021-08-20 MED ORDER — LISINOPRIL 5 MG PO TABS: 5.0000 mg | ORAL_TABLET | Freq: Every day | ORAL | 0 refills | Status: DC

## 2021-08-20 NOTE — Telephone Encounter (Signed)
Refill request from [facility] for Lisinopril. Last filled on 07/23/21 #30 with 0 refill. Follow up scheduled for 08/25/21  Will run out before then.

## 2021-08-25 ENCOUNTER — Ambulatory Visit: Payer: No Typology Code available for payment source | Admitting: Nurse Practitioner

## 2021-08-25 ENCOUNTER — Encounter: Payer: Self-pay | Admitting: Nurse Practitioner

## 2021-08-25 ENCOUNTER — Other Ambulatory Visit: Payer: Self-pay

## 2021-08-25 VITALS — BP 138/90 | HR 82 | Temp 98.2°F | Resp 16 | Ht 63.5 in | Wt 191.4 lb

## 2021-08-25 DIAGNOSIS — I10 Essential (primary) hypertension: Secondary | ICD-10-CM | POA: Diagnosis not present

## 2021-08-25 MED ORDER — LISINOPRIL 5 MG PO TABS: 5.0000 mg | ORAL_TABLET | Freq: Every day | ORAL | 1 refills | Status: DC

## 2021-08-25 NOTE — Progress Notes (Signed)
Established Patient Office Visit  Subjective:  Patient ID: Ellen Fischer, female    DOB: 1966-06-09  Age: 55 y.o. MRN: [id number]  CC:  Chief Complaint  Patient presents with   Hypertension    Follow up. Not checking her b/p at home.    HPI Ellen Fischer presents for Hypertension recheck  States that her last dose Friday 08/22/2021. States she has not check BP at home and does not have the ability. She states that her sister has a cuff and she can check her blood pressure that way  Has been modifying her diet reducing some carbs and salt  Exercise: walk a lot at work. And does stretches   Past Medical History:  Diagnosis Date   Allergy    Arthritis     No past surgical history on file.  Family History  Problem Relation Age of Onset   Hypertension Mother    Stroke Mother    Stroke Father    Breast cancer Paternal Aunt    Breast cancer Cousin     Social History   Socioeconomic History   Marital status: Divorced    Spouse name: Not on file   Number of children: Not on file   Years of education: Not on file   Highest education level: Not on file  Occupational History   Not on file  Tobacco Use   Smoking status: Never   Smokeless tobacco: Never  Substance and Sexual Activity   Alcohol use: Yes    Comment: rare   Drug use: No   Sexual activity: Never  Other Topics Concern   Not on file  Social History Narrative   Not on file   Social Determinants of Health   Financial Resource Strain: Not on file  Food Insecurity: Not on file  Transportation Needs: Not on file  Physical Activity: Not on file  Stress: Not on file  Social Connections: Not on file  Intimate Partner Violence: Not on file    Outpatient Medications Prior to Visit  Medication Sig Dispense Refill   lisinopril (ZESTRIL) 5 MG tablet Take 1 tablet (5 mg total) by mouth daily. 30 tablet 0   fluticasone (FLONASE) 50 MCG/ACT nasal spray Place 1 spray into both nostrils daily  as needed for allergies or rhinitis. 16 g 1   sodium chloride (OCEAN) 0.65 % SOLN nasal spray Place 2 sprays into both nostrils as needed for congestion.  0   No facility-administered medications prior to visit.    Allergies  Allergen Reactions   Sulfa Antibiotics     Pt does not remember    ROS Review of Systems  Constitutional:  Negative for chills and fever.  Respiratory:  Negative for cough and shortness of breath.   Cardiovascular:  Negative for chest pain.  Gastrointestinal:  Negative for nausea and vomiting.  Neurological:  Negative for headaches.     Objective:    Physical Exam Vitals and nursing note reviewed.  Constitutional:      Appearance: Normal appearance.  Cardiovascular:     Rate and Rhythm: Normal rate and regular rhythm.  Pulmonary:     Effort: Pulmonary effort is normal.     Breath sounds: Normal breath sounds.  Abdominal:     General: Bowel sounds are normal.  Neurological:     Mental Status: She is alert.  Psychiatric:        Mood and Affect: Mood normal.        Behavior: Behavior normal.  Thought Content: Thought content normal.        Judgment: Judgment normal.    BP 138/90   Pulse 82   Temp 98.2 F (36.8 C)   Resp 16   Ht 5' 3.5" (1.613 m)   Wt 191 lb 6 oz (86.8 kg)   LMP  (LMP Unknown) Comment: postmeopausal  SpO2 99%   BMI 33.37 kg/m  Wt Readings from Last 3 Encounters:  08/25/21 191 lb 6 oz (86.8 kg)  07/23/21 196 lb 4 oz (89 kg)  12/17/20 209 lb (94.8 kg)     Health Maintenance Due  Topic Date Due   Zoster Vaccines- Shingrix (1 of 2) Never done   COVID-19 Vaccine (3 - Booster for Pfizer series) 08/08/2020   MAMMOGRAM  07/19/2021    There are no preventive care reminders to display for this patient.  Lab Results  Component Value Date   TSH 4.220 07/06/2017   Lab Results  Component Value Date   WBC 4.6 12/17/2020   HGB 14.7 12/17/2020   HCT 44.7 12/17/2020   MCV 89.6 12/17/2020   PLT 201.0 12/17/2020    Lab Results  Component Value Date   NA 137 12/17/2020   K 4.6 12/17/2020   CO2 27 12/17/2020   GLUCOSE 80 12/17/2020   BUN 20 12/17/2020   CREATININE 1.01 12/17/2020   BILITOT 0.5 12/17/2020   ALKPHOS 94 12/17/2020   AST 18 12/17/2020   ALT 14 12/17/2020   PROT 7.2 12/17/2020   ALBUMIN 4.1 12/17/2020   CALCIUM 9.6 12/17/2020   GFR 62.26 12/17/2020   Lab Results  Component Value Date   CHOL 206 (H) 12/17/2020   Lab Results  Component Value Date   HDL 64.80 12/17/2020   Lab Results  Component Value Date   LDLCALC 125 (H) 12/17/2020   Lab Results  Component Value Date   TRIG 80.0 12/17/2020   Lab Results  Component Value Date   CHOLHDL 3 12/17/2020   Lab Results  Component Value Date   HGBA1C 5.4 12/17/2020      Assessment & Plan:   Problem List Items Addressed This Visit       Cardiovascular and Mediastinum   Primary hypertension - Primary    Patient was started on lisinopril 5 mg for hypertension.  Patient's been tolerating medication well and has been out of it for approximately 2 to 3 days.  When rechecked blood pressure in office was almost at goal and filling if patient has been on medication consistently would be at goal did discuss the importance of her checking her blood pressure a couple times a week outside of office and reporting that back to me to save her from coming back many times for blood pressure checks.  Patient acknowledged states that her sister has a cuff she can use we will attempt to do so.  We will keep her at 5 mg currently pending outside blood pressure readings if needed can bump up to 10 mg, pending outside readings.  Check renal function and potassium today.  Pending results      Relevant Medications   lisinopril (ZESTRIL) 5 MG tablet   Other Relevant Orders   Basic metabolic panel    No orders of the defined types were placed in this encounter.   Follow-up: No follow-ups on file.   This visit occurred during the  SARS-CoV-2 public health emergency.  Safety protocols were in place, including screening questions prior to the visit, additional usage of staff  PPE, and extensive cleaning of exam room while observing appropriate contact time as indicated for disinfecting solutions.   Audria Nine, NP

## 2021-08-25 NOTE — Patient Instructions (Signed)
Nice to see you  Check your blood pressure at home and report it to me please. I would like 2-3 times a week When you go to check the blood pressure make sure you sit and rest for at least 5 minutes prior to getting the reading. Follow up in 6 months, sooner if needed

## 2021-08-25 NOTE — Assessment & Plan Note (Signed)
Patient was started on lisinopril 5 mg for hypertension.  Patient's been tolerating medication well and has been out of it for approximately 2 to 3 days.  When rechecked blood pressure in office was almost at goal and filling if patient has been on medication consistently would be at goal did discuss the importance of her checking her blood pressure a couple times a week outside of office and reporting that back to me to save her from coming back many times for blood pressure checks.  Patient acknowledged states that her sister has a cuff she can use we will attempt to do so.  We will keep her at 5 mg currently pending outside blood pressure readings if needed can bump up to 10 mg, pending outside readings.  Check renal function and potassium today.  Pending results

## 2021-08-26 ENCOUNTER — Encounter: Payer: Self-pay | Admitting: Nurse Practitioner

## 2021-08-26 LAB — BASIC METABOLIC PANEL
BUN: 17 mg/dL (ref 6–23)
CO2: 26 mEq/L (ref 19–32)
Calcium: 9.4 mg/dL (ref 8.4–10.5)
Chloride: 104 mEq/L (ref 96–112)
Creatinine, Ser: 1.04 mg/dL (ref 0.40–1.20)
GFR: 59.82 mL/min — ABNORMAL LOW (ref >60.00)
Glucose, Bld: 80 mg/dL (ref 70–99)
Potassium: 4.1 mEq/L (ref 3.5–5.1)
Sodium: 138 mEq/L (ref 135–145)

## 2021-09-02 ENCOUNTER — Other Ambulatory Visit: Payer: Self-pay

## 2021-09-02 ENCOUNTER — Ambulatory Visit: Payer: Self-pay | Admitting: *Deleted

## 2021-09-02 VITALS — BP 145/90 | HR 74

## 2021-09-02 DIAGNOSIS — I10 Essential (primary) hypertension: Secondary | ICD-10-CM

## 2021-09-02 NOTE — Progress Notes (Signed)
Pt reports pcp requesting BP checks a couple times a week for next couple of months. Started Lisinopril 5mg  in Oct 2022. BP was borderline in Nov 2022 at f/u but pt had ran out of med for 2-3 days. Per NP notes, " We will keep her at 5 mg currently pending outside blood pressure readings if needed can bump up to 10 mg, pending outside readings." Will send progress report to pcp at end of Dec and Jan with readings.Pt agreeable.

## 2021-09-05 ENCOUNTER — Other Ambulatory Visit: Payer: Self-pay

## 2021-09-05 ENCOUNTER — Ambulatory Visit: Payer: Self-pay | Admitting: *Deleted

## 2021-09-05 VITALS — BP 143/95 | HR 72

## 2021-09-05 DIAGNOSIS — I10 Essential (primary) hypertension: Secondary | ICD-10-CM

## 2021-09-05 NOTE — Progress Notes (Signed)
Routine BP check per pt request. 

## 2021-09-08 ENCOUNTER — Ambulatory Visit: Payer: Self-pay | Admitting: *Deleted

## 2021-09-08 ENCOUNTER — Other Ambulatory Visit: Payer: Self-pay

## 2021-09-08 VITALS — BP 157/110 | HR 61

## 2021-09-08 DIAGNOSIS — I10 Essential (primary) hypertension: Secondary | ICD-10-CM

## 2021-09-08 NOTE — Progress Notes (Signed)
BP check. Endorses stressful, busy morning. Increased caffeine intake today.

## 2021-09-15 ENCOUNTER — Other Ambulatory Visit: Payer: Self-pay

## 2021-09-15 ENCOUNTER — Ambulatory Visit: Payer: Self-pay | Admitting: *Deleted

## 2021-09-15 VITALS — BP 140/87 | HR 62

## 2021-09-15 DIAGNOSIS — I10 Essential (primary) hypertension: Secondary | ICD-10-CM

## 2021-09-15 NOTE — Progress Notes (Signed)
Routine BP check per pt request. 

## 2021-09-19 ENCOUNTER — Other Ambulatory Visit: Payer: Self-pay

## 2021-09-19 ENCOUNTER — Ambulatory Visit: Payer: Self-pay | Admitting: *Deleted

## 2021-09-19 VITALS — BP 136/94 | HR 72

## 2021-09-19 DIAGNOSIS — I10 Essential (primary) hypertension: Secondary | ICD-10-CM

## 2021-09-22 ENCOUNTER — Other Ambulatory Visit: Payer: Self-pay

## 2021-09-22 ENCOUNTER — Ambulatory Visit: Payer: Self-pay | Admitting: *Deleted

## 2021-09-22 VITALS — BP 126/86 | HR 68

## 2021-09-22 DIAGNOSIS — I10 Essential (primary) hypertension: Secondary | ICD-10-CM

## 2021-09-29 ENCOUNTER — Ambulatory Visit: Payer: Self-pay | Admitting: *Deleted

## 2021-09-29 ENCOUNTER — Other Ambulatory Visit: Payer: Self-pay

## 2021-09-29 VITALS — BP 126/89 | HR 67

## 2021-09-29 DIAGNOSIS — I10 Essential (primary) hypertension: Secondary | ICD-10-CM

## 2021-09-29 NOTE — Progress Notes (Signed)
Routine BP check per pt request. 

## 2021-10-03 ENCOUNTER — Other Ambulatory Visit: Payer: Self-pay

## 2021-10-03 ENCOUNTER — Ambulatory Visit: Payer: Self-pay | Admitting: *Deleted

## 2021-10-03 VITALS — BP 136/96 | HR 60

## 2021-10-03 DIAGNOSIS — I10 Essential (primary) hypertension: Secondary | ICD-10-CM

## 2021-10-03 NOTE — Progress Notes (Signed)
Routine BP check per pt request. 

## 2021-10-06 ENCOUNTER — Ambulatory Visit: Payer: Self-pay | Admitting: *Deleted

## 2021-10-06 ENCOUNTER — Other Ambulatory Visit: Payer: Self-pay

## 2021-10-06 VITALS — BP 133/92 | HR 65

## 2021-10-06 DIAGNOSIS — I10 Essential (primary) hypertension: Secondary | ICD-10-CM

## 2021-10-13 ENCOUNTER — Ambulatory Visit: Payer: Self-pay | Admitting: *Deleted

## 2021-10-13 ENCOUNTER — Other Ambulatory Visit: Payer: Self-pay

## 2021-10-13 VITALS — BP 135/93 | HR 62

## 2021-10-13 DIAGNOSIS — I10 Essential (primary) hypertension: Secondary | ICD-10-CM

## 2021-10-17 ENCOUNTER — Ambulatory Visit: Payer: Self-pay | Admitting: *Deleted

## 2021-10-17 ENCOUNTER — Other Ambulatory Visit: Payer: Self-pay

## 2021-10-17 VITALS — BP 126/92 | HR 70

## 2021-10-17 DIAGNOSIS — I10 Essential (primary) hypertension: Secondary | ICD-10-CM

## 2021-10-20 ENCOUNTER — Ambulatory Visit: Payer: Self-pay | Admitting: *Deleted

## 2021-10-20 ENCOUNTER — Other Ambulatory Visit: Payer: Self-pay

## 2021-10-20 VITALS — BP 126/82 | HR 75

## 2021-10-20 DIAGNOSIS — I10 Essential (primary) hypertension: Secondary | ICD-10-CM

## 2021-10-21 ENCOUNTER — Other Ambulatory Visit: Payer: Self-pay | Admitting: Nurse Practitioner

## 2021-10-21 DIAGNOSIS — Z139 Encounter for screening, unspecified: Secondary | ICD-10-CM

## 2021-10-24 ENCOUNTER — Other Ambulatory Visit: Payer: Self-pay

## 2021-10-24 ENCOUNTER — Ambulatory Visit: Payer: Self-pay | Admitting: *Deleted

## 2021-10-24 VITALS — BP 146/97

## 2021-10-24 DIAGNOSIS — I10 Essential (primary) hypertension: Secondary | ICD-10-CM

## 2021-10-27 ENCOUNTER — Ambulatory Visit: Payer: Self-pay | Admitting: *Deleted

## 2021-10-27 ENCOUNTER — Ambulatory Visit
Admission: RE | Admit: 2021-10-27 | Discharge: 2021-10-27 | Disposition: A | Discharge status: Home / Self Care | Payer: No Typology Code available for payment source | Source: Ambulatory Visit | Attending: Nurse Practitioner | Admitting: Nurse Practitioner

## 2021-10-27 ENCOUNTER — Other Ambulatory Visit: Payer: Self-pay

## 2021-10-27 VITALS — BP 131/92 | HR 87

## 2021-10-27 DIAGNOSIS — Z139 Encounter for screening, unspecified: Secondary | ICD-10-CM

## 2021-10-27 DIAGNOSIS — I10 Essential (primary) hypertension: Secondary | ICD-10-CM

## 2021-11-03 ENCOUNTER — Other Ambulatory Visit: Payer: Self-pay

## 2021-11-03 ENCOUNTER — Ambulatory Visit: Payer: Self-pay | Admitting: *Deleted

## 2021-11-03 VITALS — BP 137/93 | HR 66

## 2021-11-03 DIAGNOSIS — I10 Essential (primary) hypertension: Secondary | ICD-10-CM

## 2021-11-10 ENCOUNTER — Ambulatory Visit: Payer: Self-pay | Admitting: *Deleted

## 2021-11-10 ENCOUNTER — Other Ambulatory Visit: Payer: Self-pay

## 2021-11-10 VITALS — BP 128/89 | HR 65

## 2021-11-10 DIAGNOSIS — I10 Essential (primary) hypertension: Secondary | ICD-10-CM

## 2021-11-21 ENCOUNTER — Ambulatory Visit: Payer: Self-pay | Admitting: *Deleted

## 2021-11-21 ENCOUNTER — Other Ambulatory Visit: Payer: Self-pay

## 2021-11-21 VITALS — BP 122/91 | HR 71

## 2021-11-21 DIAGNOSIS — I10 Essential (primary) hypertension: Secondary | ICD-10-CM

## 2021-11-24 ENCOUNTER — Ambulatory Visit: Payer: Self-pay | Admitting: *Deleted

## 2021-11-24 ENCOUNTER — Other Ambulatory Visit: Payer: Self-pay

## 2021-11-24 VITALS — BP 133/91 | HR 73

## 2021-11-24 DIAGNOSIS — I10 Essential (primary) hypertension: Secondary | ICD-10-CM

## 2021-11-28 ENCOUNTER — Other Ambulatory Visit: Payer: Self-pay

## 2021-11-28 ENCOUNTER — Ambulatory Visit: Payer: Self-pay | Admitting: *Deleted

## 2021-11-28 VITALS — BP 132/84 | HR 67

## 2021-11-28 DIAGNOSIS — I10 Essential (primary) hypertension: Secondary | ICD-10-CM

## 2021-12-05 ENCOUNTER — Ambulatory Visit: Payer: Self-pay | Admitting: *Deleted

## 2021-12-05 VITALS — BP 142/89

## 2021-12-05 DIAGNOSIS — I10 Essential (primary) hypertension: Secondary | ICD-10-CM

## 2021-12-08 ENCOUNTER — Ambulatory Visit: Payer: Self-pay | Admitting: *Deleted

## 2021-12-08 VITALS — BP 123/84 | HR 65

## 2021-12-08 DIAGNOSIS — I10 Essential (primary) hypertension: Secondary | ICD-10-CM

## 2021-12-26 ENCOUNTER — Ambulatory Visit: Payer: Self-pay | Admitting: *Deleted

## 2021-12-26 VITALS — BP 139/89 | HR 64

## 2021-12-26 DIAGNOSIS — I10 Essential (primary) hypertension: Secondary | ICD-10-CM

## 2021-12-29 ENCOUNTER — Ambulatory Visit: Payer: Self-pay | Admitting: *Deleted

## 2021-12-29 VITALS — BP 122/86 | HR 72 | Ht 64.0 in | Wt 187.0 lb

## 2021-12-29 DIAGNOSIS — Z0000 Encounter for general adult medical examination without abnormal findings: Secondary | ICD-10-CM

## 2021-12-29 DIAGNOSIS — E559 Vitamin D deficiency, unspecified: Secondary | ICD-10-CM

## 2021-12-29 NOTE — Progress Notes (Signed)
[employer] insurance premium discount evaluation: Labs Drawn. Replacements ROI form signed. Tobacco Free Attestation form signed.  Forms placed in paper chart.  

## 2021-12-30 ENCOUNTER — Encounter: Payer: Self-pay | Admitting: Registered Nurse

## 2021-12-30 LAB — CMP12+LP+TP+TSH+6AC+CBC/D/PLT
ALT: 10 IU/L (ref 0–32)
AST: 15 IU/L (ref 0–40)
Albumin/Globulin Ratio: 1.7 (ref 1.2–2.2)
Albumin: 4.3 g/dL (ref 3.8–4.9)
Alkaline Phosphatase: 95 IU/L (ref 44–121)
BUN/Creatinine Ratio: 17 (ref 9–23)
BUN: 18 mg/dL (ref 6–24)
Basophils Absolute: 0 10*3/uL (ref 0.0–0.2)
Basos: 1 %
Bilirubin Total: 0.3 mg/dL (ref 0.0–1.2)
Calcium: 9.6 mg/dL (ref 8.7–10.2)
Chloride: 105 mmol/L (ref 96–106)
Chol/HDL Ratio: 3.2 ratio (ref 0.0–4.4)
Cholesterol, Total: 194 mg/dL (ref 100–199)
Creatinine, Ser: 1.06 mg/dL — ABNORMAL HIGH (ref 0.57–1.00)
EOS (ABSOLUTE): 0.1 10*3/uL (ref 0.0–0.4)
Eos: 1 %
Estimated CHD Risk: <0.5 times avg. (ref 0.0–1.0)
Free Thyroxine Index: 1.9 (ref 1.2–4.9)
GGT: 9 IU/L (ref 0–60)
Globulin, Total: 2.6 g/dL (ref 1.5–4.5)
Glucose: 70 mg/dL (ref 70–99)
HDL: 61 mg/dL (ref >39)
Hematocrit: 43.7 % (ref 34.0–46.6)
Hemoglobin: 14.7 g/dL (ref 11.1–15.9)
Immature Grans (Abs): 0 10*3/uL (ref 0.0–0.1)
Immature Granulocytes: 0 %
Iron: 90 ug/dL (ref 27–159)
LDH: 194 IU/L (ref 119–226)
LDL Chol Calc (NIH): 116 mg/dL — ABNORMAL HIGH (ref 0–99)
Lymphocytes Absolute: 0.9 10*3/uL (ref 0.7–3.1)
Lymphs: 18 %
MCH: 29.8 pg (ref 26.6–33.0)
MCHC: 33.6 g/dL (ref 31.5–35.7)
MCV: 89 fL (ref 79–97)
Monocytes Absolute: 0.4 10*3/uL (ref 0.1–0.9)
Monocytes: 8 %
Neutrophils Absolute: 3.7 10*3/uL (ref 1.4–7.0)
Neutrophils: 72 %
Phosphorus: 3.8 mg/dL (ref 3.0–4.3)
Platelets: 209 10*3/uL (ref 150–450)
Potassium: 4.7 mmol/L (ref 3.5–5.2)
RBC: 4.93 x10E6/uL (ref 3.77–5.28)
RDW: 13.2 % (ref 11.7–15.4)
Sodium: 142 mmol/L (ref 134–144)
T3 Uptake Ratio: 25 % (ref 24–39)
T4, Total: 7.5 ug/dL (ref 4.5–12.0)
TSH: 4.18 u[IU]/mL (ref 0.450–4.500)
Total Protein: 6.9 g/dL (ref 6.0–8.5)
Triglycerides: 92 mg/dL (ref 0–149)
Uric Acid: 5.6 mg/dL (ref 3.0–7.2)
VLDL Cholesterol Cal: 17 mg/dL (ref 5–40)
WBC: 5.1 10*3/uL (ref 3.4–10.8)
eGFR: 61 mL/min/{1.73_m2} (ref >59)

## 2021-12-30 LAB — HGB A1C W/O EAG: Hgb A1c MFr Bld: 5.6 % (ref 4.8–5.6)

## 2021-12-30 LAB — VITAMIN D 25 HYDROXY (VIT D DEFICIENCY, FRACTURES): Vit D, 25-Hydroxy: 15 ng/mL — ABNORMAL LOW (ref 30.0–100.0)

## 2021-12-30 NOTE — Addendum Note (Signed)
Addended by: Albina Billet A on: 12/30/2021 08:21 AM ? ? Modules accepted: Orders ? ?

## 2022-01-05 ENCOUNTER — Ambulatory Visit: Payer: Self-pay | Admitting: *Deleted

## 2022-01-05 VITALS — BP 145/96 | HR 63

## 2022-01-05 DIAGNOSIS — I10 Essential (primary) hypertension: Secondary | ICD-10-CM

## 2022-01-16 ENCOUNTER — Ambulatory Visit: Payer: Self-pay | Admitting: *Deleted

## 2022-01-16 VITALS — BP 146/95 | HR 61

## 2022-01-16 DIAGNOSIS — I10 Essential (primary) hypertension: Secondary | ICD-10-CM

## 2022-01-23 ENCOUNTER — Ambulatory Visit: Payer: Self-pay | Admitting: *Deleted

## 2022-01-23 VITALS — BP 123/86 | HR 65

## 2022-01-23 DIAGNOSIS — I10 Essential (primary) hypertension: Secondary | ICD-10-CM

## 2022-01-30 ENCOUNTER — Ambulatory Visit: Payer: Self-pay | Admitting: *Deleted

## 2022-01-30 VITALS — BP 144/98 | HR 59

## 2022-01-30 DIAGNOSIS — I10 Essential (primary) hypertension: Secondary | ICD-10-CM

## 2022-02-02 ENCOUNTER — Ambulatory Visit: Payer: Self-pay | Admitting: *Deleted

## 2022-02-02 VITALS — BP 138/90 | HR 61

## 2022-02-02 DIAGNOSIS — I10 Essential (primary) hypertension: Secondary | ICD-10-CM

## 2022-02-16 ENCOUNTER — Ambulatory Visit: Payer: Self-pay | Admitting: *Deleted

## 2022-02-20 ENCOUNTER — Ambulatory Visit: Payer: Self-pay | Admitting: *Deleted

## 2022-02-20 VITALS — BP 125/88 | HR 68

## 2022-02-20 DIAGNOSIS — I10 Essential (primary) hypertension: Secondary | ICD-10-CM

## 2022-02-23 ENCOUNTER — Ambulatory Visit: Payer: Self-pay | Admitting: *Deleted

## 2022-02-23 VITALS — BP 136/93 | HR 62

## 2022-02-23 DIAGNOSIS — I10 Essential (primary) hypertension: Secondary | ICD-10-CM

## 2022-03-06 ENCOUNTER — Ambulatory Visit: Payer: Self-pay | Admitting: *Deleted

## 2022-03-06 VITALS — BP 134/91 | HR 64

## 2022-03-06 DIAGNOSIS — I10 Essential (primary) hypertension: Secondary | ICD-10-CM

## 2022-03-09 ENCOUNTER — Ambulatory Visit: Payer: Self-pay | Admitting: *Deleted

## 2022-03-09 VITALS — BP 129/87 | HR 72

## 2022-03-09 DIAGNOSIS — I10 Essential (primary) hypertension: Secondary | ICD-10-CM

## 2022-03-13 ENCOUNTER — Ambulatory Visit: Payer: Self-pay | Admitting: *Deleted

## 2022-03-13 VITALS — BP 126/88 | HR 62

## 2022-03-13 DIAGNOSIS — I10 Essential (primary) hypertension: Secondary | ICD-10-CM

## 2022-03-15 ENCOUNTER — Other Ambulatory Visit: Payer: Self-pay | Admitting: Nurse Practitioner

## 2022-03-15 DIAGNOSIS — I10 Essential (primary) hypertension: Secondary | ICD-10-CM

## 2022-03-20 ENCOUNTER — Ambulatory Visit: Payer: Self-pay | Admitting: *Deleted

## 2022-03-20 VITALS — BP 132/91 | HR 59

## 2022-03-20 DIAGNOSIS — I10 Essential (primary) hypertension: Secondary | ICD-10-CM

## 2022-03-25 ENCOUNTER — Ambulatory Visit: Payer: No Typology Code available for payment source | Admitting: Nurse Practitioner

## 2022-03-25 ENCOUNTER — Encounter: Payer: Self-pay | Admitting: Nurse Practitioner

## 2022-03-25 VITALS — BP 138/88 | HR 67 | Temp 96.9°F | Resp 14 | Ht 64.0 in | Wt 192.2 lb

## 2022-03-25 DIAGNOSIS — I10 Essential (primary) hypertension: Secondary | ICD-10-CM

## 2022-03-25 NOTE — Patient Instructions (Signed)
Nice to see you today Follow up with me in 5 months for your physical with a Pap smear Continue checking your blood pressure like you have been  Continue doing your stretches for exercise. Working on weight reduction can be very helpful in regards to lowering blood pressure

## 2022-03-25 NOTE — Assessment & Plan Note (Signed)
Patient currently maintained on lisinopril 5 mg.  Tolerating medication well.  She is getting her blood pressure checked Mondays and Fridays at the occupational RN's office.  They are on epic I can see some of the notes blood pressures have been mostly below goal some above goal.  We will work on dietary modifications currently versus increasing medication at this juncture.  Did we have Mediterranean diet handout to patient at discharge.  Follow-up towards the end of the year for CPE inclusive of Pap smear

## 2022-03-25 NOTE — Progress Notes (Signed)
   Established Patient Office Visit  Subjective   Patient ID: Ellen Fischer, female    DOB: [birth year]  Age: 56 y.o. MRN: [id number]  Chief Complaint  Patient presents with   Hypertension    Follow up      States that she is tolerating the medication well. Will check her BP twice a week at work with the occupational RN.  Patient states that she does have a cough but is not all the time and thinks is related more to allergies versus the ACE inhibitor.  She is currently doing stretches daily in regards to exercise.  Blood pressure slightly above goal in office upon recheck within goal   Colonoscopy: Patient has not had 1 and not interested at this time.  Did review iFOB, Cologuard, colonoscopy with patient  Mammograms: Recently done 10/28/2021  Pap smear: Patient has not followed by GYN last Pap smear was in 2018 through primary care provider which was negative cells and negative HPV.  Due 2023     Review of Systems  Constitutional:  Negative for chills and fever.  Respiratory:  Positive for cough. Negative for shortness of breath.   Cardiovascular:  Negative for chest pain.  Neurological:  Negative for dizziness and headaches.      Objective:     BP 138/88   Pulse 67   Temp (!) 96.9 F (36.1 C)   Resp 14   Ht 5\' 4"  (1.626 m)   Wt 192 lb 4 oz (87.2 kg)   LMP  (LMP Unknown) Comment: postmeopausal  SpO2 100%   BMI 33.00 kg/m  BP Readings from Last 3 Encounters:  03/25/22 138/88  03/20/22 (!) 132/91  03/13/22 126/88   Wt Readings from Last 3 Encounters:  03/25/22 192 lb 4 oz (87.2 kg)  12/29/21 187 lb (84.8 kg)  08/25/21 191 lb 6 oz (86.8 kg)      Physical Exam Vitals and nursing note reviewed.  Constitutional:      Appearance: Normal appearance.  Cardiovascular:     Rate and Rhythm: Normal rate and regular rhythm.     Heart sounds: Normal heart sounds.  Pulmonary:     Breath sounds: Normal breath sounds.  Musculoskeletal:     Right lower leg: No  edema.     Left lower leg: No edema.  Neurological:     Mental Status: She is alert.      No results found for any visits on 03/25/22.    The 10-year ASCVD risk score (Arnett DK, et al., 2019) is: 3.4%    Assessment & Plan:   Problem List Items Addressed This Visit       Cardiovascular and Mediastinum   Primary hypertension - Primary    Patient currently maintained on lisinopril 5 mg.  Tolerating medication well.  She is getting her blood pressure checked Mondays and Fridays at the occupational RN's office.  They are on epic I can see some of the notes blood pressures have been mostly below goal some above goal.  We will work on dietary modifications currently versus increasing medication at this juncture.  Did we have Mediterranean diet handout to patient at discharge.  Follow-up towards the end of the year for CPE inclusive of Pap smear       Return in about 5 months (around 08/25/2022) for CPE with PAP.    08/27/2022, NP

## 2022-04-03 ENCOUNTER — Ambulatory Visit: Payer: Self-pay | Admitting: *Deleted

## 2022-04-03 VITALS — BP 132/82

## 2022-04-03 DIAGNOSIS — I10 Essential (primary) hypertension: Secondary | ICD-10-CM

## 2022-08-25 ENCOUNTER — Encounter: Payer: No Typology Code available for payment source | Admitting: Nurse Practitioner

## 2022-09-17 ENCOUNTER — Other Ambulatory Visit: Payer: Self-pay | Admitting: Family

## 2022-09-17 DIAGNOSIS — I10 Essential (primary) hypertension: Secondary | ICD-10-CM

## 2022-09-22 ENCOUNTER — Encounter: Payer: Self-pay | Admitting: Nurse Practitioner

## 2022-09-22 DIAGNOSIS — I10 Essential (primary) hypertension: Secondary | ICD-10-CM

## 2022-09-23 MED ORDER — LISINOPRIL 5 MG PO TABS: 5.0000 mg | ORAL_TABLET | Freq: Every day | ORAL | 0 refills | Status: DC

## 2022-10-21 ENCOUNTER — Ambulatory Visit (INDEPENDENT_AMBULATORY_CARE_PROVIDER_SITE_OTHER): Payer: No Typology Code available for payment source | Admitting: Nurse Practitioner

## 2022-10-21 ENCOUNTER — Encounter: Payer: Self-pay | Admitting: Nurse Practitioner

## 2022-10-21 VITALS — BP 162/102 | HR 68 | Ht 64.0 in | Wt 205.0 lb

## 2022-10-21 DIAGNOSIS — H6123 Impacted cerumen, bilateral: Secondary | ICD-10-CM | POA: Insufficient documentation

## 2022-10-21 DIAGNOSIS — Z5320 Procedure and treatment not carried out because of patient's decision for unspecified reasons: Secondary | ICD-10-CM | POA: Diagnosis not present

## 2022-10-21 DIAGNOSIS — Z0001 Encounter for general adult medical examination with abnormal findings: Secondary | ICD-10-CM

## 2022-10-21 DIAGNOSIS — E669 Obesity, unspecified: Secondary | ICD-10-CM | POA: Diagnosis not present

## 2022-10-21 DIAGNOSIS — I10 Essential (primary) hypertension: Secondary | ICD-10-CM

## 2022-10-21 DIAGNOSIS — Z0000 Encounter for general adult medical examination without abnormal findings: Secondary | ICD-10-CM | POA: Insufficient documentation

## 2022-10-21 LAB — CBC
HCT: 44.4 % (ref 36.0–46.0)
Hemoglobin: 14.9 g/dL (ref 12.0–15.0)
MCHC: 33.6 g/dL (ref 30.0–36.0)
MCV: 89.2 fl (ref 78.0–100.0)
Platelets: 208 10*3/uL (ref 150.0–400.0)
RBC: 4.97 Mil/uL (ref 3.87–5.11)
RDW: 14.1 % (ref 11.5–15.5)
WBC: 4.6 10*3/uL (ref 4.0–10.5)

## 2022-10-21 LAB — COMPREHENSIVE METABOLIC PANEL
ALT: 12 U/L (ref 0–35)
AST: 17 U/L (ref 0–37)
Albumin: 4.2 g/dL (ref 3.5–5.2)
Alkaline Phosphatase: 83 U/L (ref 39–117)
BUN: 18 mg/dL (ref 6–23)
CO2: 28 mEq/L (ref 19–32)
Calcium: 9.4 mg/dL (ref 8.4–10.5)
Chloride: 105 mEq/L (ref 96–112)
Creatinine, Ser: 1.02 mg/dL (ref 0.40–1.20)
GFR: 60.74 mL/min (ref >60.00)
Glucose, Bld: 84 mg/dL (ref 70–99)
Potassium: 4.2 mEq/L (ref 3.5–5.1)
Sodium: 140 mEq/L (ref 135–145)
Total Bilirubin: 0.4 mg/dL (ref 0.2–1.2)
Total Protein: 7.1 g/dL (ref 6.0–8.3)

## 2022-10-21 LAB — LIPID PANEL
Cholesterol: 192 mg/dL (ref 0–200)
HDL: 67.6 mg/dL (ref >39.00)
LDL Cholesterol: 108 mg/dL — ABNORMAL HIGH (ref 0–99)
NonHDL: 124.7
Total CHOL/HDL Ratio: 3
Triglycerides: 83 mg/dL (ref 0.0–149.0)
VLDL: 16.6 mg/dL (ref 0.0–40.0)

## 2022-10-21 LAB — TSH: TSH: 3.72 u[IU]/mL (ref 0.35–5.50)

## 2022-10-21 LAB — HEMOGLOBIN A1C: Hgb A1c MFr Bld: 5.6 % (ref 4.6–6.5)

## 2022-10-21 NOTE — Assessment & Plan Note (Signed)
Continue working on healthy lifestyle modifications 

## 2022-10-21 NOTE — Assessment & Plan Note (Signed)
Verbal consent obtained.  Patient was prepped per office policy.  A mixture of water and hydroperoxide was used to irrigate both ears.  Patient developed discomfort and pain and procedures.  Impactions were not removed.  Patient was offered ENT referral if she chooses

## 2022-10-21 NOTE — Progress Notes (Signed)
Established Patient Office Visit  Subjective   Patient ID: Ellen Fischer, female    DOB: September 21, 1966  Age: 57 y.o. MRN: [id number]  Chief Complaint  Patient presents with   Annual Exam    HPI   HTN: States that she is not checking Bp at home. No cuff at home. 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  Patient currently maintained on lisinopril tolerating medication well   for complete physical and follow up of chronic conditions.  Immunizations: -Tetanus: Completed in 2018 -Influenza: Completed this season 06/19/2022 -Shingles: information discussed in office -Pneumonia: too young  -HPV: aged out  Diet: [place]. States 3 meals a day. States that she is drinking sodas. Trying to limit to one soada a day and coffee. Some water Exercise: No regular exercise. States that she will do the stretches and daily  Eye exam: PRN Dental exam: Completes semi-annually   Pap Smear: Completed in 2018, negative cells. Without HPV needs repeat in 2023. Patient declined today  Mammogram: Completed in 10/27/2021, repeat in 1 year.  Patient has a coming up this week  Colonoscopy: Discussed iFOB, Cologuard, colonoscopy.  Patient would like to think about it.  Lung Cancer Screening: Does not qualify   Dexa: Too young   Sleep; States that she goes to bed 930-10 and get up around 6a. States that she generally feels rested. Does not snore     Review of Systems  Constitutional:  Negative for chills and fever.  Respiratory:  Negative for shortness of breath.   Cardiovascular:  Negative for chest pain and leg swelling.  Gastrointestinal:  Negative for abdominal pain, blood in stool, constipation, diarrhea, nausea and vomiting.       BM daily   Genitourinary:  Negative for dysuria and hematuria.  Neurological:  Negative for tingling and headaches.  Psychiatric/Behavioral:  Negative for hallucinations and suicidal ideas.        Objective:     BP (!) 162/102   Pulse 68   Ht 5\' 4"  (1.626 m)   Wt 205 lb (93 kg)   LMP  (LMP Unknown) Comment: postmeopausal  SpO2 99%   BMI 35.19 kg/m  BP Readings from Last 3 Encounters:  10/21/22 (!) 162/102  04/03/22 132/82  03/25/22 138/88   Wt Readings from Last 3 Encounters:  10/21/22 205 lb (93 kg)  03/25/22 192 lb 4 oz (87.2 kg)  12/29/21 187 lb (84.8 kg)      Physical Exam Vitals and nursing note reviewed.  Constitutional:      Appearance: Normal appearance.  HENT:     Right Ear: Ear canal and external ear normal. There is impacted cerumen.     Left Ear: Ear canal and external ear normal. There is impacted cerumen.     Mouth/Throat:     Mouth: Mucous membranes are moist.     Pharynx: Oropharynx is clear.  Eyes:     Extraocular Movements: Extraocular movements intact.     Pupils: Pupils are equal, round, and reactive to light.  Cardiovascular:     Rate and Rhythm: Normal rate and regular rhythm.     Pulses: Normal pulses.     Heart sounds: Normal heart sounds.  Pulmonary:     Effort: Pulmonary effort is normal.     Breath sounds: Normal breath sounds.  Abdominal:     General: Bowel sounds are normal. There is no distension.     Palpations: There is  no mass.     Tenderness: There is no abdominal tenderness.     Hernia: No hernia is present.  Musculoskeletal:     Right lower leg: No edema.     Left lower leg: No edema.  Lymphadenopathy:     Cervical: No cervical adenopathy.  Skin:    General: Skin is warm.  Neurological:     General: No focal deficit present.     Mental Status: She is alert.     Deep Tendon Reflexes:     Reflex Scores:      Bicep reflexes are 2+ on the right side and 2+ on the left side.      Patellar reflexes are 2+ on the right side and 2+ on the left side.    Comments: Bilateral upper and lower extremity strength 5/5  Psychiatric:        Mood and Affect: Mood normal.        Behavior: Behavior normal.        Thought  Content: Thought content normal.        Judgment: Judgment normal.      No results found for any visits on 10/21/22.    The 10-year ASCVD risk score (Arnett DK, et al., 2019) is: 5.2%    Assessment & Plan:   Problem List Items Addressed This Visit       Cardiovascular and Mediastinum   Primary hypertension    Elevated in office today.  Patient currently maintained on lisinopril 5 mg.  Continue getting blood pressure checked at work.  Patient is under lots of stress as of late dealing with buying a house      Relevant Orders   CBC   Comprehensive metabolic panel     Nervous and Auditory   Bilateral impacted cerumen    Verbal consent obtained.  Patient was prepped per office policy.  A mixture of water and hydroperoxide was used to irrigate both ears.  Patient developed discomfort and pain and procedures.  Impactions were not removed.  Patient was offered ENT referral if she chooses      Relevant Orders   Ear Lavage     Other   Preventative health care - Primary    Discussed age-appropriate immunizations and screening exams.  Patient is up-to-date on Tdap and flu vaccine.  Information discussed about shingles vaccine.  Patient declines CRC screening and Pap smear today.  Did discuss the importance of both exams.  States he like to think about it and get back with me.  Patient was given information at discharge in regards to preventative healthcare maintenance with anticipatory guidance for her age range.      Relevant Orders   CBC   Comprehensive metabolic panel   Hemoglobin A1c   TSH   Lipid panel   Cervical cancer screening declined   Obesity (BMI 30-39.9)    Continue working on healthy lifestyle modifications.      Relevant Orders   Hemoglobin A1c   Lipid panel    Return in about 1 year (around 10/22/2023) for CPE and Labs.    Romilda Garret, NP

## 2022-10-21 NOTE — Assessment & Plan Note (Signed)
Discussed age-appropriate immunizations and screening exams.  Patient is up-to-date on Tdap and flu vaccine.  Information discussed about shingles vaccine.  Patient declines CRC screening and Pap smear today.  Did discuss the importance of both exams.  States he like to think about it and get back with me.  Patient was given information at discharge in regards to preventative healthcare maintenance with anticipatory guidance for her age range.

## 2022-10-21 NOTE — Patient Instructions (Signed)
Nice to see you today I will be in touch with the labs once I have them Follow up in 1 year for your next physical, sooner if you need me  Recommendation is 43mins of exercise 5 times a week

## 2022-10-21 NOTE — Assessment & Plan Note (Signed)
Elevated in office today.  Patient currently maintained on lisinopril 5 mg.  Continue getting blood pressure checked at work.  Patient is under lots of stress as of late dealing with buying a house

## 2022-11-03 IMAGING — MG MM DIGITAL SCREENING BILAT W/ TOMO AND CAD
8 series · 8 of 24 positions shown · non-contrast
Comparison: Previous exam(s).

ACR Breast Density Category a: The breast tissue is almost entirely
fatty.

CLINICAL DATA: Screening.

EXAM:
DIGITAL SCREENING BILATERAL MAMMOGRAM WITH TOMOSYNTHESIS AND CAD
TECHNIQUE: Bilateral screening digital craniocaudal and mediolateral oblique
mammograms were obtained. Bilateral screening digital breast
tomosynthesis was performed. The images were evaluated with
computer-aided detection.

[R CC synth-2D]
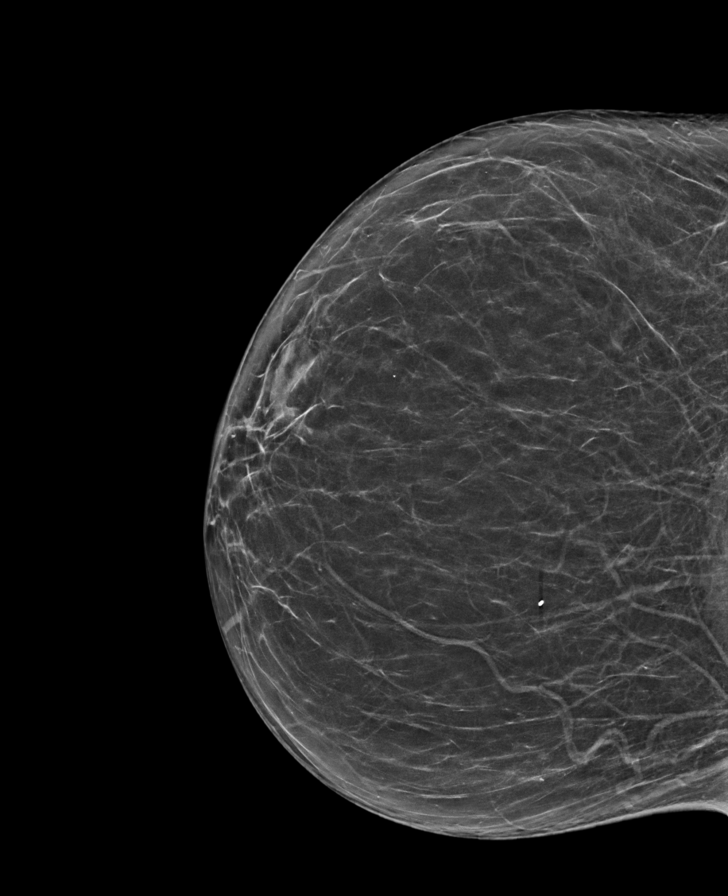

[L CC synth-2D]
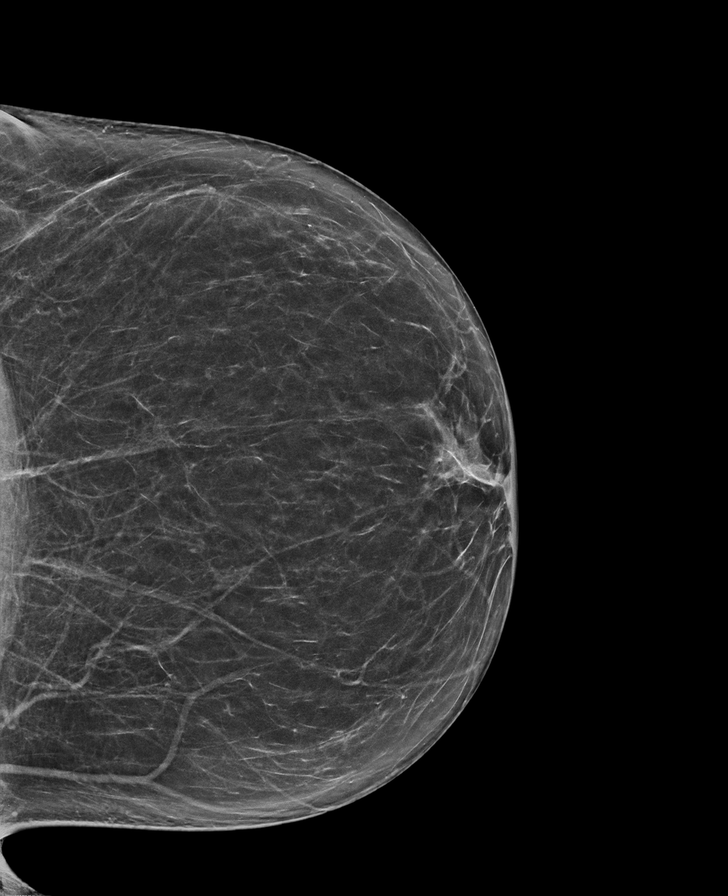

[L MLO synth-2D]
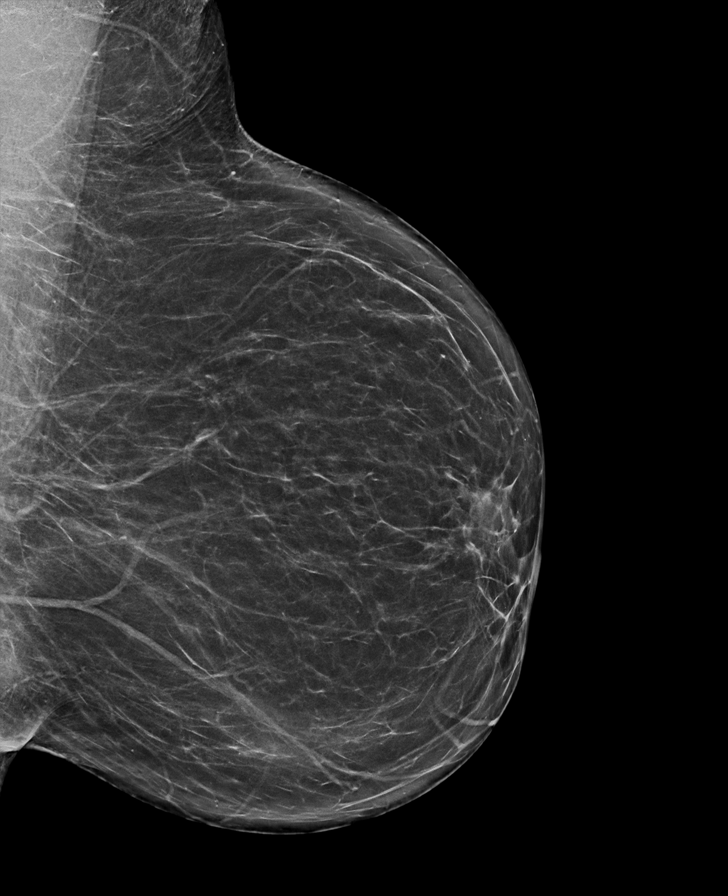

[R MLO synth-2D]
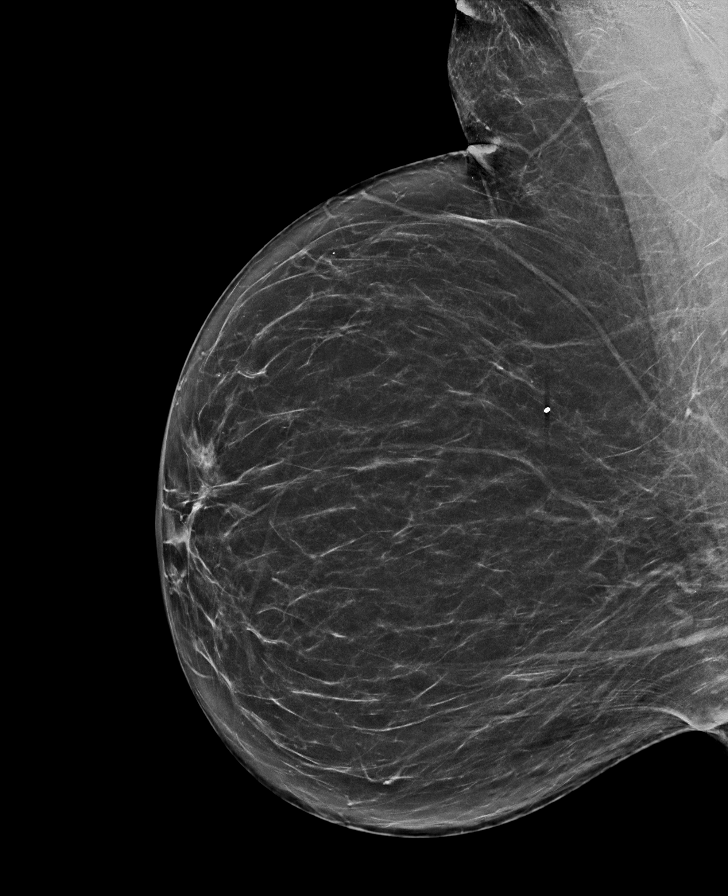

[L MLO tomo · tomo slice 37/73.0]
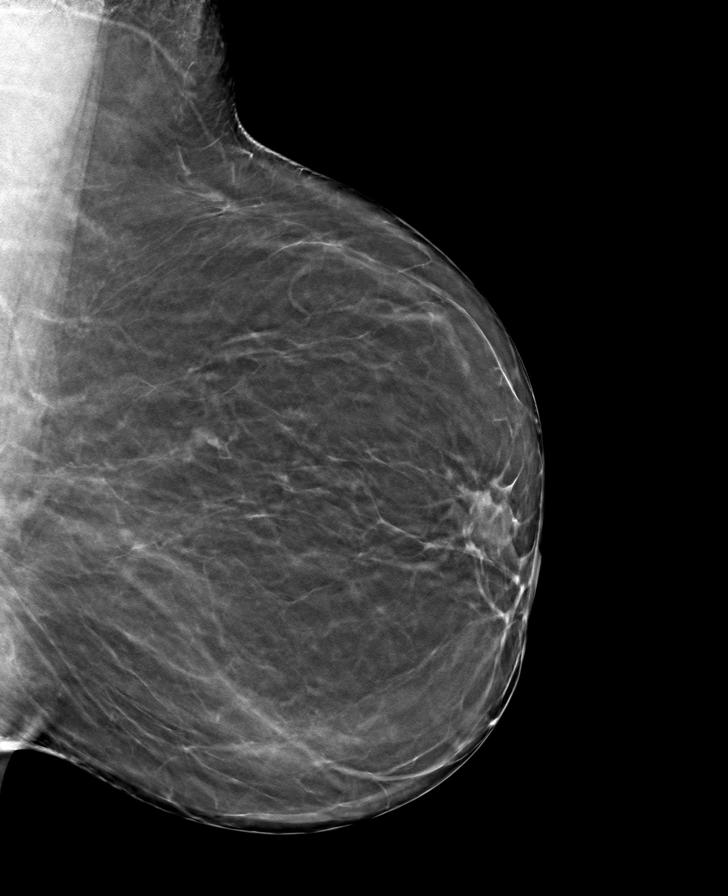

[R MLO tomo · tomo slice 39/78.0]
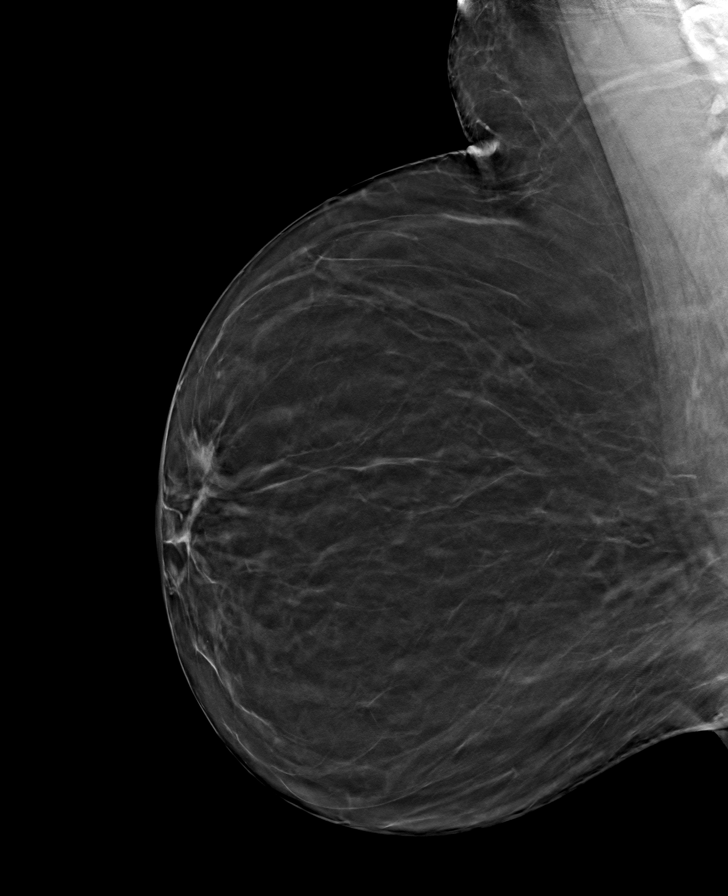

[R CC tomo · tomo slice 36/71.0]
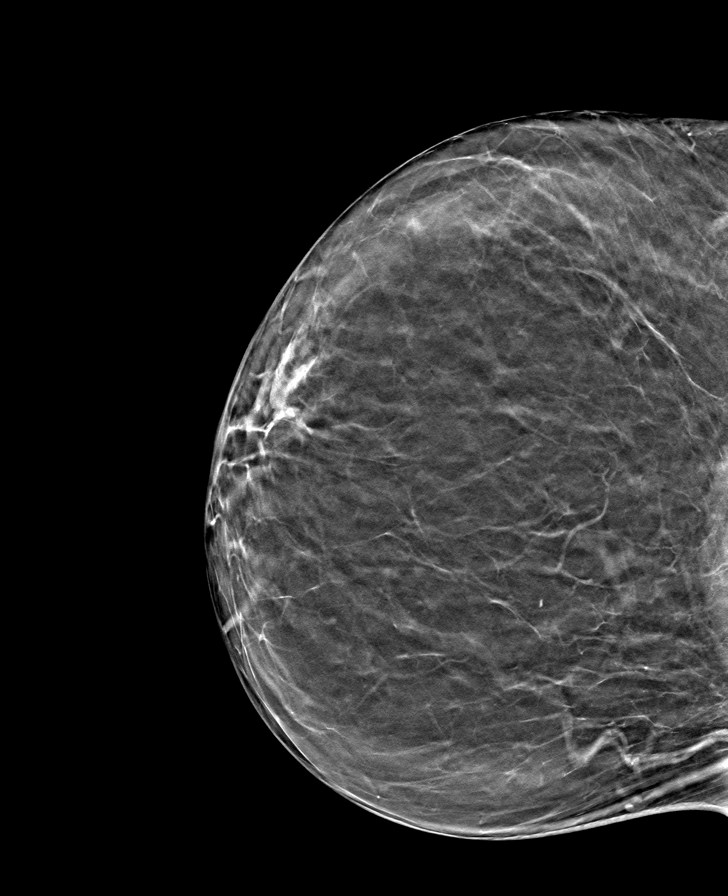

[L CC tomo · tomo slice 36/71.0]
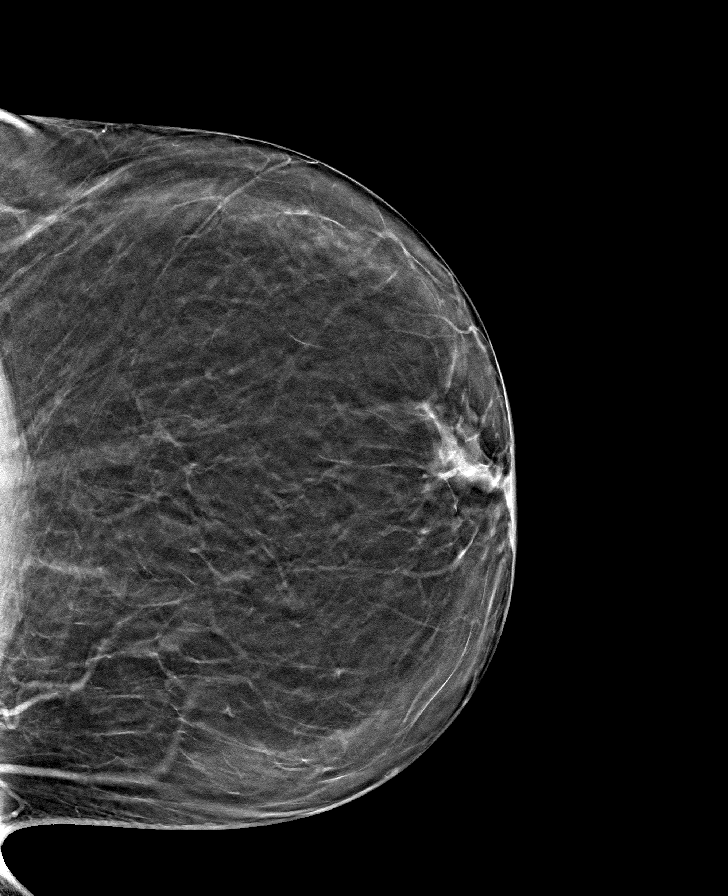

[8 of 24 positions shown; findings below may reference images not displayed]

FINDINGS: There are no findings suspicious for malignancy.
IMPRESSION: No mammographic evidence of malignancy. A result letter of this
screening mammogram will be mailed directly to the patient.

RECOMMENDATION:
Screening mammogram in one year. (Code:0E-3-N98)

BI-RADS CATEGORY  1: Negative.

## 2022-12-19 ENCOUNTER — Other Ambulatory Visit: Payer: Self-pay | Admitting: Nurse Practitioner

## 2022-12-19 DIAGNOSIS — I10 Essential (primary) hypertension: Secondary | ICD-10-CM

## 2022-12-25 ENCOUNTER — Encounter: Payer: Self-pay | Admitting: Registered Nurse

## 2022-12-25 ENCOUNTER — Telehealth: Payer: Self-pay | Admitting: Registered Nurse

## 2022-12-25 DIAGNOSIS — E559 Vitamin D deficiency, unspecified: Secondary | ICD-10-CM

## 2022-12-25 NOTE — Telephone Encounter (Signed)
Epic reviewed had labs with [facility] Jan 2024  BP greater than 135/85; A1c less than 7 and LDL less than 130 met requirements for 2025 Be Well complete paperwork with patient and recheck weight and BP draw vitamin D   Latest Reference Range & Units 10/21/22 09:41  COMPREHENSIVE METABOLIC PANEL  Rpt  Sodium 135 - 145 mEq/L 140  Potassium 3.5 - 5.1 mEq/L 4.2  Chloride 96 - 112 mEq/L 105  CO2 19 - 32 mEq/L 28  Glucose 70 - 99 mg/dL 84  BUN 6 - 23 mg/dL 18  Creatinine 0.40 - 1.20 mg/dL 1.02  Calcium 8.4 - 10.5 mg/dL 9.4  Alkaline Phosphatase 39 - 117 U/L 83  Albumin 3.5 - 5.2 g/dL 4.2  AST 0 - 37 U/L 17  ALT 0 - 35 U/L 12  Total Protein 6.0 - 8.3 g/dL 7.1  Total Bilirubin 0.2 - 1.2 mg/dL 0.4  GFR >60.00 mL/min 60.74  Total CHOL/HDL Ratio  3  Cholesterol 0 - 200 mg/dL 192  HDL Cholesterol >39.00 mg/dL 67.60  LDL (calc) 0 - 99 mg/dL 108 (H)  NonHDL  124.70  Triglycerides 0.0 - 149.0 mg/dL 83.0  VLDL 0.0 - 40.0 mg/dL 16.6  WBC 4.0 - 10.5 K/uL 4.6  RBC 3.87 - 5.11 Mil/uL 4.97  Hemoglobin 12.0 - 15.0 g/dL 14.9  HCT 36.0 - 46.0 % 44.4  MCV 78.0 - 100.0 fl 89.2  MCHC 30.0 - 36.0 g/dL 33.6  RDW 11.5 - 15.5 % 14.1  Platelets 150.0 - 400.0 K/uL 208.0  Hemoglobin A1C 4.6 - 6.5 % 5.6  TSH 0.35 - 5.50 uIU/mL 3.72  (H): Data is abnormally high Rpt: View report in Results Review for more information

## 2022-12-28 ENCOUNTER — Ambulatory Visit: Payer: No Typology Code available for payment source | Admitting: Occupational Medicine

## 2022-12-28 DIAGNOSIS — E559 Vitamin D deficiency, unspecified: Secondary | ICD-10-CM

## 2022-12-28 DIAGNOSIS — Z0000 Encounter for general adult medical examination without abnormal findings: Secondary | ICD-10-CM

## 2022-12-28 DIAGNOSIS — I10 Essential (primary) hypertension: Secondary | ICD-10-CM

## 2022-12-28 NOTE — Progress Notes (Signed)
Be well insurance premium discount evaluation:   Declined Vit  D level draw reports PCP did. Couldn't find a record of it. Patient aware and decline another draw. Reports BP elevated didn't want done today would try later this week. Weight done in Jan at PCP.  Patient completed PCM office visit epic reviewed by RN Kimrey and transcribed. Labs  Tobacco attestation signed. [facility] ROI formed signed. Forms placed in the chart.   Patient given handouts for [place] pharmacies and discount drugs list,MyChart, Tele doc setup, Tele doc Behavioral, [place] counseling and [facility] counseling.  What to do for infectious illness protocol. Given handout for list of medications that can be filled at [facility]. Given Clinic hours and Clinic Email.

## 2022-12-29 NOTE — Telephone Encounter (Signed)
noted 

## 2023-01-02 NOTE — Telephone Encounter (Signed)
Be Well paperwork signed 12/29/22 LDL 108 Hgba1c 5.6 weight 205lbs.  Last year BP 122/86 LDL 11 Hgab1c 5.6 and weight 187lbs recommend weight loss/DASH diet

## 2023-01-20 NOTE — Telephone Encounter (Signed)
Discussed with RN Kimrey patient still needs BP follow up 01/19/23 and she stated she attempted to contact patient left message at work

## 2023-03-21 NOTE — Progress Notes (Signed)
Patient has had follow up labs

## 2023-03-31 NOTE — Telephone Encounter (Signed)
Noted patient refused. 

## 2023-05-02 ENCOUNTER — Other Ambulatory Visit: Payer: Self-pay | Admitting: Registered Nurse

## 2023-05-02 DIAGNOSIS — E559 Vitamin D deficiency, unspecified: Secondary | ICD-10-CM

## 2023-05-02 NOTE — Progress Notes (Signed)
My chart message sent to patient; patient saw [facility] 10/21/22 blood pressure elevated 162/102 recommended that she get blood pressure checks with RN at work and continue lisinopril and decrease caffeine intake.  Patient saw RN Bess Kinds 12/28/22 for 2025 Be Well appt had labs at [facility] LDL 108 Hgba1c 5.6 weight [id number] met requirements for insurance discount FY 2025 RN Kimrey notified HR team; NP signed provider Be Well 2025 paperwork 12/29/22; RN Kimrey attempted BP follow up with patient and she refused see epic note  Ellen Fischer, There have been new national guidelines for vitamin D.  I have cancelled the lab order to recheck your level.  We will not be checking vitamin D levels any longer at [facility].  I recommend that you take 2000 units by mouth daily with meal over the counter since you have history of low level 15.  Vitamin D is essential to bone health and immune system functioning.  I recommend follow up with your [facility] for elevated blood pressure.  Please let us know if you have further questions or concerns.  Sincerely,  Albina Billet NP-C

## 2023-05-02 NOTE — Telephone Encounter (Signed)
My chart message sent to patient today 05/02/23 reminder blood pressure follow up

## 2023-07-03 ENCOUNTER — Encounter: Payer: Self-pay | Admitting: Nurse Practitioner

## 2023-07-03 DIAGNOSIS — I10 Essential (primary) hypertension: Secondary | ICD-10-CM

## 2023-07-07 MED ORDER — LISINOPRIL 5 MG PO TABS: 5.0000 mg | ORAL_TABLET | Freq: Every day | ORAL | 1 refills | Status: DC

## 2023-07-19 ENCOUNTER — Encounter: Payer: Self-pay | Admitting: Registered Nurse

## 2023-07-19 ENCOUNTER — Telehealth: Payer: Self-pay | Admitting: Registered Nurse

## 2023-07-19 DIAGNOSIS — W010XXA Fall on same level from slipping, tripping and stumbling without subsequent striking against object, initial encounter: Secondary | ICD-10-CM

## 2023-07-19 MED ORDER — BIOFREEZE COOL THE PAIN 4 % EX GEL: 1.0000 | Freq: Four times a day (QID) | CUTANEOUS | Status: AC | PRN

## 2023-07-19 MED ORDER — IBUPROFEN 800 MG PO TABS: 800.0000 mg | ORAL_TABLET | Freq: Three times a day (TID) | ORAL | Status: AC

## 2023-07-19 MED ORDER — ACETAMINOPHEN 500 MG PO TABS: 1000.0000 mg | ORAL_TABLET | Freq: Four times a day (QID) | ORAL | Status: AC | PRN

## 2023-07-19 NOTE — Telephone Encounter (Signed)
Notified by HR Tonya patient left work early and to contact her.  Patient stated both knees were hurting at work despite ibuprofen.  Went home to rest iced again and one knee improved but the other did not.  Did not try biofreeze or tylenol.  Has not tried epsom salt soak in bathtub as afraid will not be able to get back out of tub  Denied swelling stated one knee very stiff and painful tonight.  Patient has heating pad will try heat tonight and tylenol 1000mg  po every 6 hours to see if pain and stiffness improves.  Patient prefers to [facility] if she has to see provider there tomorrow.  Patient to contact me in am if not able to perform work duties and will schedule appt with [facility] provider.  Discussed location of clinic with provider on [facility] at [facility].  Discussed with patient if feels able to work tomorrow I will see her onsite after 9am at [facility].  Patient A&Ox3 agreed with plan of care and had no further questions at that time.  Discussed with patient if work restrictions needed she will have to see [facility] provider as I am unable to write work restrictions in [employer] per contract.  HR Tonya notified patient follow up completed and re-evaluation in am and if unable to perform work duties will be scheduled at [facility] office.

## 2023-07-19 NOTE — Telephone Encounter (Signed)
Contacted by HR Tim patient with fall at work this am in [employer].  Contacted patient and stated she was carrying a box and tripped over another box on floor and fell onto floor box and box she was carrying.  Denied hitting head/loss of consciousness, bleeding, inability to bear weight walked from workcenter to clinic with HR.  Stated knees are stiff denied gooseegg/swelling.  Stated has ice packs on both knees.  Doesn't feel like anything broken.  Denied falling on hands/wrists/back/neck pain.  Discussed to expect some pain from fall.  Can take acetaminophen 1000mg  po q6h prn pain and/or ibuprofen 800mg  po q8h with food.  May apply biofreeze gel QID topical prn prn.  Consider epsom salt soak bath tonight and applying ice 15 minutes after work today and prior to bedtime if swelling/pain.  Exitcare handouts on contusion, muscle strain, knee pain sent to patient.  Discussed clinic closed today no RN onsite.  NP onsite tomorrow 9a-12p.  See HR personnel today and notify supervisor if worsening symptoms/pain at work/unable to perform work duties and appt will be scheduled with [facility] provider.  Patient A&Ox3 spoke full sentences without difficulty no audible wheezing/congestion/throat clearing during 5 minute telephone call.  Patient agreed with plan of care and had no further questions at this time.  Unsure if able to get into my chart asked for me to send exitcare handouts to personal email also.

## 2023-07-20 ENCOUNTER — Ambulatory Visit
Admission: RE | Admit: 2023-07-20 | Discharge: 2023-07-20 | Disposition: A | Discharge status: Home / Self Care | Payer: Worker's Compensation | Source: Ambulatory Visit | Attending: Physician Assistant | Admitting: Physician Assistant

## 2023-07-20 ENCOUNTER — Other Ambulatory Visit: Payer: Self-pay | Admitting: Physician Assistant

## 2023-07-20 DIAGNOSIS — S8992XA Unspecified injury of left lower leg, initial encounter: Secondary | ICD-10-CM | POA: Insufficient documentation

## 2023-07-20 NOTE — Telephone Encounter (Signed)
Patient contacted NP stayed home as pain left knee 5/10 stiff painful to stand and walk.  Right knee bruised but doing okay.  Patient took one dose ibuprofen yesterday at time of injury did not help.  Did not take any further pain medication.  Did not have tylenol or biofreeze at home.  Tried heat and ice and neither helped with pain/stiffness.  Able to weight bear.  Preferred appt after 12pm today.  Scheduled with PA [facility] today for evaluation and treatment.  HR team notified.  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  Patient A&Ox3 spoke full sentences without difficulty agreed with plan of care.

## 2023-07-22 NOTE — Telephone Encounter (Signed)
Isystoc reviewed and patient on restrictions from [facility] with follow up appt 1430 27 Jul 2023 scheduled.  HR Tonya notified and given printed work restrictions note from [facility].

## 2023-10-19 ENCOUNTER — Other Ambulatory Visit: Payer: Self-pay | Admitting: Nurse Practitioner

## 2023-10-19 DIAGNOSIS — Z1231 Encounter for screening mammogram for malignant neoplasm of breast: Secondary | ICD-10-CM

## 2023-10-25 ENCOUNTER — Ambulatory Visit (INDEPENDENT_AMBULATORY_CARE_PROVIDER_SITE_OTHER): Payer: No Typology Code available for payment source | Admitting: Nurse Practitioner

## 2023-10-25 ENCOUNTER — Encounter: Payer: Self-pay | Admitting: Nurse Practitioner

## 2023-10-25 VITALS — BP 136/88 | HR 66 | Temp 97.0°F | Ht 63.0 in | Wt 212.4 lb

## 2023-10-25 DIAGNOSIS — E669 Obesity, unspecified: Secondary | ICD-10-CM

## 2023-10-25 DIAGNOSIS — E559 Vitamin D deficiency, unspecified: Secondary | ICD-10-CM | POA: Diagnosis not present

## 2023-10-25 DIAGNOSIS — Z23 Encounter for immunization: Secondary | ICD-10-CM | POA: Diagnosis not present

## 2023-10-25 DIAGNOSIS — Z13220 Encounter for screening for lipoid disorders: Secondary | ICD-10-CM | POA: Diagnosis not present

## 2023-10-25 DIAGNOSIS — Z131 Encounter for screening for diabetes mellitus: Secondary | ICD-10-CM | POA: Diagnosis not present

## 2023-10-25 DIAGNOSIS — I10 Essential (primary) hypertension: Secondary | ICD-10-CM | POA: Diagnosis not present

## 2023-10-25 DIAGNOSIS — Z0000 Encounter for general adult medical examination without abnormal findings: Secondary | ICD-10-CM

## 2023-10-25 DIAGNOSIS — Z13820 Encounter for screening for osteoporosis: Secondary | ICD-10-CM

## 2023-10-25 LAB — LIPID PANEL
Cholesterol: 201 mg/dL — ABNORMAL HIGH (ref 0–200)
HDL: 65.6 mg/dL (ref >39.00)
LDL Cholesterol: 120 mg/dL — ABNORMAL HIGH (ref 0–99)
NonHDL: 135.6
Total CHOL/HDL Ratio: 3
Triglycerides: 77 mg/dL (ref 0.0–149.0)
VLDL: 15.4 mg/dL (ref 0.0–40.0)

## 2023-10-25 LAB — COMPREHENSIVE METABOLIC PANEL
ALT: 12 U/L (ref 0–35)
AST: 18 U/L (ref 0–37)
Albumin: 4.3 g/dL (ref 3.5–5.2)
Alkaline Phosphatase: 82 U/L (ref 39–117)
BUN: 18 mg/dL (ref 6–23)
CO2: 27 meq/L (ref 19–32)
Calcium: 9.3 mg/dL (ref 8.4–10.5)
Chloride: 104 meq/L (ref 96–112)
Creatinine, Ser: 0.95 mg/dL (ref 0.40–1.20)
GFR: 65.68 mL/min (ref >60.00)
Glucose, Bld: 82 mg/dL (ref 70–99)
Potassium: 4 meq/L (ref 3.5–5.1)
Sodium: 140 meq/L (ref 135–145)
Total Bilirubin: 0.4 mg/dL (ref 0.2–1.2)
Total Protein: 6.9 g/dL (ref 6.0–8.3)

## 2023-10-25 LAB — CBC
HCT: 44.5 % (ref 36.0–46.0)
Hemoglobin: 14.7 g/dL (ref 12.0–15.0)
MCHC: 33.1 g/dL (ref 30.0–36.0)
MCV: 90.2 fL (ref 78.0–100.0)
Platelets: 198 10*3/uL (ref 150.0–400.0)
RBC: 4.93 Mil/uL (ref 3.87–5.11)
RDW: 14.3 % (ref 11.5–15.5)
WBC: 4.4 10*3/uL (ref 4.0–10.5)

## 2023-10-25 LAB — TSH: TSH: 5.04 u[IU]/mL (ref 0.35–5.50)

## 2023-10-25 LAB — HEMOGLOBIN A1C: Hgb A1c MFr Bld: 5.7 % (ref 4.6–6.5)

## 2023-10-25 LAB — VITAMIN D 25 HYDROXY (VIT D DEFICIENCY, FRACTURES): VITD: 17.19 ng/mL — ABNORMAL LOW (ref 30.00–100.00)

## 2023-10-25 NOTE — Assessment & Plan Note (Signed)
Patient currently maintained on lisinopril 10 mg daily.  Blood pressure currently within normal limits in office today.  Continue medication as prescribed

## 2023-10-25 NOTE — Assessment & Plan Note (Signed)
Pending A1c, lipid panel, TSH.  Continue working on healthy lifestyle modifications

## 2023-10-25 NOTE — Assessment & Plan Note (Signed)
Discussed age-appropriate immunizations and screening exams.  Did discuss patient's personal, surgical, social, family histories.  Patient is up-to-date on all age-appropriate vaccinations she would like.  Update flu vaccine today.  Did discuss shingles vaccine in office.  Patient is overdue for CRC screening.  Did discuss treatment options and patient will think about it.  Mammogram is scheduled.  Bone density scan ordered today.  Patient was given information to call and get that set up.  Able to referral to GYN for cervical cancer screening.  Patient was given information at discharge about preventative healthcare maintenance with anticipatory guidance.

## 2023-10-25 NOTE — Patient Instructions (Signed)
Nice to see you today I will be in touch with the labs once I have them Follow up with me in 1 year, sooner if you need me  Consider getting the shingles vaccine (shingrix) Call and scheduled the bone density scan

## 2023-10-25 NOTE — Progress Notes (Signed)
Established Patient Office Visit  Subjective   Patient ID: Ellen Fischer, female    DOB: 1966/07/23  Age: 58 y.o. MRN: [id number]  Chief Complaint  Patient presents with   Annual Exam    Pt would like flu shot.     HPI  HTN: states she does not check it at home nor office   for complete physical and follow up of chronic conditions.  Immunizations: -Tetanus: Completed in 2018 -Influenza: update today -Shingles: Discussed in office -Pneumonia: too young   Diet: Fair diet. She is eating 3 meals a day. Not a snacker. She will drink coffee, water and 1 soda a day  Exercise:  States that she will do something every night and at work she will walk. She will do some stretches and toe touches.   Eye exam: prn readers  Dental exam: Completes semi-annually    Colonoscopy: Did discuss iFOB, Cologuard, colonoscopy.  Patient declines at this current juncture.  Discussed the importance of colorectal cancer screening and catching stop early.  Patient will think about it and reach out to me if she decides to do any CRC screening. Lung Cancer Screening: N/A  Pap smear: 07/20/2017, negative cells and HPV.  Patient prefers GYN to do exam.  Amatory referral to GYN placed  Mammorgram: Scheduled for 11/03/2023.  Dexa: has not had a period for 10 years.  Order placed for [facility]  Sleep: she will go to bed 903-10 and will wake up around 545. Feels rested. Does snore     Review of Systems  Constitutional:  Negative for chills and fever.  Respiratory:  Negative for shortness of breath.   Cardiovascular:  Negative for chest pain and leg swelling.  Gastrointestinal:  Negative for abdominal pain, blood in stool, constipation, diarrhea, nausea and vomiting.       BM daily   Genitourinary:  Negative for dysuria and hematuria.  Neurological:  Negative for tingling and headaches.  Psychiatric/Behavioral:  Negative for hallucinations and suicidal ideas.       Objective:      BP 136/88   Pulse 66   Temp (!) 97 F (36.1 C) (Oral)   Ht 5\' 3"  (1.6 m)   Wt 212 lb 6.4 oz (96.3 kg)   LMP  (LMP Unknown) Comment: postmeopausal  SpO2 99%   BMI 37.62 kg/m  BP Readings from Last 3 Encounters:  10/25/23 136/88  10/21/22 (!) 162/102  04/03/22 132/82   Wt Readings from Last 3 Encounters:  10/25/23 212 lb 6.4 oz (96.3 kg)  10/21/22 205 lb (93 kg)  03/25/22 192 lb 4 oz (87.2 kg)   SpO2 Readings from Last 3 Encounters:  10/25/23 99%  10/21/22 99%  03/25/22 100%      Physical Exam Vitals and nursing note reviewed.  Constitutional:      Appearance: Normal appearance.  HENT:     Right Ear: Tympanic membrane, ear canal and external ear normal.     Left Ear: Tympanic membrane, ear canal and external ear normal.     Mouth/Throat:     Mouth: Mucous membranes are moist.     Pharynx: Oropharynx is clear.  Eyes:     Extraocular Movements: Extraocular movements intact.     Pupils: Pupils are equal, round, and reactive to light.  Cardiovascular:     Rate and Rhythm: Normal rate and regular rhythm.     Pulses: Normal pulses.     Heart sounds: Normal heart sounds.  Pulmonary:  Effort: Pulmonary effort is normal.     Breath sounds: Normal breath sounds.  Abdominal:     General: Bowel sounds are normal. There is no distension.     Palpations: There is no mass.     Tenderness: There is no abdominal tenderness.     Hernia: No hernia is present.  Musculoskeletal:     Right lower leg: No edema.     Left lower leg: No edema.  Lymphadenopathy:     Cervical: No cervical adenopathy.  Skin:    General: Skin is warm.  Neurological:     General: No focal deficit present.     Mental Status: She is alert.     Deep Tendon Reflexes:     Reflex Scores:      Bicep reflexes are 2+ on the right side and 2+ on the left side.      Patellar reflexes are 2+ on the right side and 2+ on the left side.    Comments: Bilateral upper and lower extremity strength 5/5   Psychiatric:        Mood and Affect: Mood normal.        Behavior: Behavior normal.        Thought Content: Thought content normal.        Judgment: Judgment normal.      No results found for any visits on 10/25/23.    The 10-year ASCVD risk score (Arnett DK, et al., 2019) is: 3.8%    Assessment & Plan:   Problem List Items Addressed This Visit       Cardiovascular and Mediastinum   Primary hypertension   Patient currently maintained on lisinopril 10 mg daily.  Blood pressure currently within normal limits in office today.  Continue medication as prescribed      Relevant Orders   CBC   Comprehensive metabolic panel   TSH     Other   Need for influenza vaccination   Relevant Orders   Flu vaccine trivalent PF, 6mos and older(Flulaval,Afluria,Fluarix,Fluzone) (Completed)   Preventative health care - Primary   Discussed age-appropriate immunizations and screening exams.  Did discuss patient's personal, surgical, social, family histories.  Patient is up-to-date on all age-appropriate vaccinations she would like.  Update flu vaccine today.  Did discuss shingles vaccine in office.  Patient is overdue for CRC screening.  Did discuss treatment options and patient will think about it.  Mammogram is scheduled.  Bone density scan ordered today.  Patient was given information to call and get that set up.  Able to referral to GYN for cervical cancer screening.  Patient was given information at discharge about preventative healthcare maintenance with anticipatory guidance.      Relevant Orders   Comprehensive metabolic panel   Hemoglobin A1c   TSH   Ambulatory referral to Gynecology   Obesity (BMI 30-39.9)   Pending A1c, lipid panel, TSH.  Continue working on healthy lifestyle modifications      Relevant Orders   Hemoglobin A1c   TSH   Other Visit Diagnoses       Screening for lipid disorders       Relevant Orders   Lipid panel     Screening for diabetes mellitus        Relevant Orders   Hemoglobin A1c     Vitamin D deficiency       Relevant Orders   VITAMIN D 25 Hydroxy (Vit-D Deficiency, Fractures)     Screening for osteoporosis  Relevant Orders   DG Bone Density       Return in about 1 year (around 10/24/2024) for CPE and Labs.    Audria Nine, NP

## 2023-10-26 ENCOUNTER — Encounter: Payer: Self-pay | Admitting: Nurse Practitioner

## 2023-10-26 ENCOUNTER — Other Ambulatory Visit: Payer: Self-pay | Admitting: Nurse Practitioner

## 2023-10-26 DIAGNOSIS — R7303 Prediabetes: Secondary | ICD-10-CM

## 2023-10-26 DIAGNOSIS — E559 Vitamin D deficiency, unspecified: Secondary | ICD-10-CM

## 2023-10-26 MED ORDER — VITAMIN D (ERGOCALCIFEROL) 1.25 MG (50000 UNIT) PO CAPS: 50000.0000 [IU] | ORAL_CAPSULE | ORAL | 0 refills | Status: DC

## 2023-11-03 ENCOUNTER — Ambulatory Visit
Admission: RE | Admit: 2023-11-03 | Discharge: 2023-11-03 | Disposition: A | Discharge status: Home / Self Care | Payer: No Typology Code available for payment source | Source: Ambulatory Visit

## 2023-11-03 DIAGNOSIS — Z1231 Encounter for screening mammogram for malignant neoplasm of breast: Secondary | ICD-10-CM

## 2023-11-03 NOTE — Telephone Encounter (Signed)
Isystoc reviewed and patient returned to full duty at follow up visit 07/27/23

## 2023-11-08 ENCOUNTER — Encounter: Payer: Self-pay | Admitting: Nurse Practitioner

## 2023-12-14 ENCOUNTER — Other Ambulatory Visit (HOSPITAL_COMMUNITY)
Admission: RE | Admit: 2023-12-14 | Discharge: 2023-12-14 | Disposition: A | Discharge status: Home / Self Care | Source: Ambulatory Visit | Attending: Obstetrics and Gynecology | Admitting: Obstetrics and Gynecology

## 2023-12-14 ENCOUNTER — Encounter: Payer: Self-pay | Admitting: Obstetrics and Gynecology

## 2023-12-14 ENCOUNTER — Ambulatory Visit (INDEPENDENT_AMBULATORY_CARE_PROVIDER_SITE_OTHER): Payer: No Typology Code available for payment source | Admitting: Obstetrics and Gynecology

## 2023-12-14 VITALS — BP 159/93 | HR 70 | Wt 204.0 lb

## 2023-12-14 DIAGNOSIS — Z01419 Encounter for gynecological examination (general) (routine) without abnormal findings: Secondary | ICD-10-CM

## 2023-12-14 DIAGNOSIS — Z124 Encounter for screening for malignant neoplasm of cervix: Secondary | ICD-10-CM | POA: Insufficient documentation

## 2023-12-14 NOTE — Progress Notes (Signed)
 ANNUAL EXAM Patient name: Ellen Fischer MRN [id number]  Date of birth: 31-Jul-1966 Chief Complaint:   Gynecologic Exam  History of Present Illness:   Ellen Fischer is a 58 y.o. menopausal F being seen today for a routine annual exam.  Current complaints: None  Last pap 2018. Results were: normal per pt. H/O abnormal pap: no Last mammogram: 11/03/23. Results were: normal. Family h/o breast cancer: yes second degree relatives x 2 Last colonoscopy: never, no fhx     12/14/2023    9:33 AM 10/25/2023    8:58 AM 10/21/2022    9:19 AM 12/17/2020   10:40 AM 09/11/2019    9:42 AM  Depression screen PHQ 2/9  Decreased Interest 0 0 1 0 0  Down, Depressed, Hopeless 0 0 1 1 0  PHQ - 2 Score 0 0 2 1 0  Altered sleeping 1 1 1  0   Tired, decreased energy 1 1 0 0   Change in appetite 1 0 0 0   Feeling bad or failure about yourself  1 0 1 0   Trouble concentrating 0 0 0 0   Moving slowly or fidgety/restless 0 0 0 0   Suicidal thoughts 0 0 0 0   PHQ-9 Score 4 2 4 1    Difficult doing work/chores Not difficult at all Not difficult at all Not difficult at all          12/14/2023    9:33 AM 10/25/2023    8:58 AM 10/21/2022    9:19 AM  GAD 7 : Generalized Anxiety Score  Nervous, Anxious, on Edge 1 0 2  Control/stop worrying 1 0 1  Worry too much - different things 1 0 1  Trouble relaxing 0 0 0  Restless 0 0 0  Easily annoyed or irritable 1 0 1  Afraid - awful might happen 1 0 1  Total GAD 7 Score 5 0 6  Anxiety Difficulty Not difficult at all Not difficult at all Not difficult at all     Review of Systems:   Pertinent items are noted in HPI Denies any headaches, blurred vision, fatigue, shortness of breath, chest pain, abdominal pain, abnormal vaginal discharge/itching/odor/irritation, problems with periods, bowel movements, urination, or intercourse unless otherwise stated above. Pertinent History Reviewed:  Reviewed past medical,surgical, social and family history.   Reviewed problem list, medications and allergies. Physical Assessment:   Vitals:   12/14/23 0933  BP: (!) 159/93  Pulse: 70  Weight: 204 lb (92.5 kg)  Body mass index is 36.14 kg/m.        Physical Examination:   General appearance - well appearing, and in no distress  Mental status - alert, oriented to person, place, and time  Chest - respiratory effort normal  Heart - normal peripheral perfusion  Breasts - declined  Abdomen - soft, nontender, nondistended, no masses or organomegaly  Pelvic - VULVA: normal appearing vulva with no masses, tenderness or lesions  VAGINA: normal appearing vagina with normal color and discharge, no lesions  CERVIX: normal appearing cervix without discharge or lesions, no CMT  Thin prep pap is done with HR HPV cotesting  UTERUS: uterus is felt to be normal size, shape, consistency and nontender   ADNEXA: No adnexal masses or tenderness noted.  Chaperone present for exam  No results found for this or any previous visit (from the past 24 hours).  Assessment & Plan:  1) Well-Woman Exam Mammogram: in 1 year, or sooner if problems Colonoscopy: schedule  screening colonoscopy as soon as possible Pap: Collected today Declines STI screening Reviewed PCP follow up for BP and shingles vaccine  Follow-up: No follow-ups on file.  Lennart Pall, MD 12/14/2023 9:50 AM

## 2023-12-14 NOTE — Patient Instructions (Signed)
 It was nice meeting you today. You will see your results in the MyChart app within 1 week.

## 2023-12-14 NOTE — Progress Notes (Signed)
 Patient presents for Annual. Pt notes having HTN on Meds.  Last pap:  2018 WNL  Contraception: Post-menopausal Mammogram: Up to date: 11/03/23 WNL P.Aunt and First Cousin had Breast Cancer   STD Screening: Declines  CC: Annual/None

## 2023-12-16 ENCOUNTER — Encounter: Payer: Self-pay | Admitting: Obstetrics and Gynecology

## 2023-12-16 LAB — CYTOLOGY - PAP
Comment: NEGATIVE
Diagnosis: NEGATIVE
High risk HPV: NEGATIVE

## 2024-03-06 ENCOUNTER — Ambulatory Visit

## 2024-03-06 VITALS — BP 129/86 | Ht 63.5 in | Wt 201.0 lb

## 2024-03-06 DIAGNOSIS — Z0000 Encounter for general adult medical examination without abnormal findings: Secondary | ICD-10-CM

## 2024-03-06 NOTE — Progress Notes (Signed)
 Be well form and HIPAA Release form signed

## 2024-03-08 ENCOUNTER — Telehealth: Payer: Self-pay | Admitting: Registered Nurse

## 2024-03-08 ENCOUNTER — Encounter: Payer: Self-pay | Admitting: Registered Nurse

## 2024-03-08 DIAGNOSIS — Z0000 Encounter for general adult medical examination without abnormal findings: Secondary | ICD-10-CM

## 2024-03-08 NOTE — Telephone Encounter (Signed)
 Patient met with HR Thersia Flax and completed Be Well BP 129/86 Jun 2025 weight 201lbs LDL 120 and Hgba1c 5.7   Previous results LDL 108 Hgba1c 5.6 205lbs BP 162/102  Be Well 2026 ROI and tobacco attestation signed by patient.  NP completed provider portion 03/07/24 and gave HR Caryn Clause form patient met requirements for insurance discount starting 05 Jul 2024.

## 2024-03-08 NOTE — Telephone Encounter (Signed)
 Patient met with HR Thersia Flax and completed Be Well BP 129/86 Jun 2025 weight 201lbs LDL 120 and Hgba1c 5.7  Be Well 2026 ROI and tobacco attestation signed by patient.  NP completed provider portion 03/07/24 and gave HR Caryn Clause form patient met requirements for insurance discount starting 05 Jul 2024.

## 2024-03-26 ENCOUNTER — Other Ambulatory Visit: Payer: Self-pay | Admitting: Nurse Practitioner

## 2024-03-26 DIAGNOSIS — I10 Essential (primary) hypertension: Secondary | ICD-10-CM

## 2024-10-05 ENCOUNTER — Other Ambulatory Visit: Payer: Self-pay | Admitting: Nurse Practitioner

## 2024-10-05 DIAGNOSIS — I10 Essential (primary) hypertension: Secondary | ICD-10-CM

## 2024-10-24 ENCOUNTER — Ambulatory Visit: Payer: No Typology Code available for payment source | Admitting: Nurse Practitioner

## 2024-10-24 ENCOUNTER — Encounter: Payer: Self-pay | Admitting: Nurse Practitioner

## 2024-10-24 VITALS — BP 130/86 | HR 70 | Temp 97.6°F | Ht 62.5 in | Wt 190.4 lb

## 2024-10-24 DIAGNOSIS — Z0000 Encounter for general adult medical examination without abnormal findings: Secondary | ICD-10-CM | POA: Diagnosis not present

## 2024-10-24 DIAGNOSIS — R7303 Prediabetes: Secondary | ICD-10-CM

## 2024-10-24 DIAGNOSIS — E559 Vitamin D deficiency, unspecified: Secondary | ICD-10-CM | POA: Diagnosis not present

## 2024-10-24 DIAGNOSIS — E669 Obesity, unspecified: Secondary | ICD-10-CM

## 2024-10-24 DIAGNOSIS — E7800 Pure hypercholesterolemia, unspecified: Secondary | ICD-10-CM | POA: Diagnosis not present

## 2024-10-24 DIAGNOSIS — Z6834 Body mass index (BMI) 34.0-34.9, adult: Secondary | ICD-10-CM

## 2024-10-24 DIAGNOSIS — I10 Essential (primary) hypertension: Secondary | ICD-10-CM

## 2024-10-24 DIAGNOSIS — Z23 Encounter for immunization: Secondary | ICD-10-CM

## 2024-10-24 DIAGNOSIS — Z1231 Encounter for screening mammogram for malignant neoplasm of breast: Secondary | ICD-10-CM

## 2024-10-24 LAB — COMPREHENSIVE METABOLIC PANEL WITH GFR
ALT: 11 U/L (ref 3–35)
AST: 16 U/L (ref 5–37)
Albumin: 4.1 g/dL (ref 3.5–5.2)
Alkaline Phosphatase: 74 U/L (ref 39–117)
BUN: 20 mg/dL (ref 6–23)
CO2: 29 meq/L (ref 19–32)
Calcium: 9.5 mg/dL (ref 8.4–10.5)
Chloride: 107 meq/L (ref 96–112)
Creatinine, Ser: 0.93 mg/dL (ref 0.40–1.20)
GFR: 66.91 mL/min
Glucose, Bld: 78 mg/dL (ref 70–99)
Potassium: 4 meq/L (ref 3.5–5.1)
Sodium: 141 meq/L (ref 135–145)
Total Bilirubin: 0.4 mg/dL (ref 0.2–1.2)
Total Protein: 6.8 g/dL (ref 6.0–8.3)

## 2024-10-24 LAB — CBC WITH DIFFERENTIAL/PLATELET
Basophils Absolute: 0 K/uL (ref 0.0–0.1)
Basophils Relative: 0.9 % (ref 0.0–3.0)
Eosinophils Absolute: 0.1 K/uL (ref 0.0–0.7)
Eosinophils Relative: 1.4 % (ref 0.0–5.0)
HCT: 43.6 % (ref 36.0–46.0)
Hemoglobin: 14.4 g/dL (ref 12.0–15.0)
Lymphocytes Relative: 21.3 % (ref 12.0–46.0)
Lymphs Abs: 0.9 K/uL (ref 0.7–4.0)
MCHC: 32.9 g/dL (ref 30.0–36.0)
MCV: 89.1 fl (ref 78.0–100.0)
Monocytes Absolute: 0.4 K/uL (ref 0.1–1.0)
Monocytes Relative: 9.9 % (ref 3.0–12.0)
Neutro Abs: 2.7 K/uL (ref 1.4–7.7)
Neutrophils Relative %: 66.5 % (ref 43.0–77.0)
Platelets: 187 K/uL (ref 150.0–400.0)
RBC: 4.9 Mil/uL (ref 3.87–5.11)
RDW: 14.8 % (ref 11.5–15.5)
WBC: 4.1 K/uL (ref 4.0–10.5)

## 2024-10-24 LAB — LIPID PANEL
Cholesterol: 187 mg/dL (ref 28–200)
HDL: 61.4 mg/dL
LDL Cholesterol: 110 mg/dL — ABNORMAL HIGH (ref 10–99)
NonHDL: 125.65
Total CHOL/HDL Ratio: 3
Triglycerides: 76 mg/dL (ref 10.0–149.0)
VLDL: 15.2 mg/dL (ref 0.0–40.0)

## 2024-10-24 LAB — VITAMIN D 25 HYDROXY (VIT D DEFICIENCY, FRACTURES): VITD: 14.65 ng/mL — ABNORMAL LOW (ref 30.00–100.00)

## 2024-10-24 LAB — TSH: TSH: 4.29 u[IU]/mL (ref 0.35–5.50)

## 2024-10-24 LAB — HEMOGLOBIN A1C: Hgb A1c MFr Bld: 5.5 % (ref 4.6–6.5)

## 2024-10-24 NOTE — Assessment & Plan Note (Signed)
 History of same pending vitamin D  level today.

## 2024-10-24 NOTE — Patient Instructions (Signed)
 Nice to see you today I will be in touch with the labs once I have them  Follow up with me in 1 year, sooner if you need me   We did update your flu and pneumonia (prevnar 20) vaccine today

## 2024-10-24 NOTE — Assessment & Plan Note (Signed)
 History of the same pending A1c, lipid panel, A1c.  Patient has lost weight since last year continue working on healthy lifestyle modifications she is down and that of at least 14 pounds

## 2024-10-24 NOTE — Progress Notes (Signed)
 "  Established Patient Office Visit  Subjective   Patient ID: Ellen Fischer, female    DOB: [birth year]  Age: 59 y.o. MRN: [id number]  Chief Complaint  Patient presents with   Annual Exam    Flu vaccine     HPI  HTN: Patient currently maintained on lisinopril  5 mg daily. Does not check at home but tolerates medication well   for complete physical and follow up of chronic conditions.  Immunizations: -Tetanus: Completed in 2018 -Influenza: update today  -Shingles: discussed in office  -Pneumonia: update today  -COVID: Original series  Diet: Fair diet. She is eating 3 meals a day. She does not snack. She does coffee water and tea. She will have a soda once a week  Exercise: No regular exercise. She does strecths and some situps. She will move a lot with work   Eye exam: PRN Dental exam: Completes semi-annually    Colonoscopy: discussed in office. She will let me know. Did discuss cologuard and colonoscopy  Lung Cancer Screening: N/A  Pap smear: 12/14/2023 negative cells negative HPV patient due 2030  Mammogram: 11/03/2023, repeat 1 year  DEXA: Ordered but never completed  Sleep: going to bed around 930 and get up around 545a. She feels rested if she does sleep all night. Sometimes she will urinate and the have trouble going back to sleep       Review of Systems  Constitutional:  Negative for chills and fever.  Respiratory:  Negative for shortness of breath.   Cardiovascular:  Negative for chest pain and leg swelling.  Gastrointestinal:  Negative for abdominal pain, blood in stool, constipation, diarrhea, nausea and vomiting.       BM daily   Genitourinary:  Negative for dysuria and hematuria.  Neurological:  Negative for dizziness, tingling and headaches.  Psychiatric/Behavioral:  Negative for hallucinations and suicidal ideas.       Objective:     BP 130/86   Pulse 70   Temp 97.6 F (36.4 C) (Oral)   Ht 5' 2.5 (1.588 m)   Wt 190 lb 6.4 oz (86.4 kg)    LMP  (LMP Unknown) Comment: postmeopausal  SpO2 99%   BMI 34.27 kg/m  BP Readings from Last 3 Encounters:  10/24/24 130/86  03/06/24 129/86  12/14/23 (!) 159/93   Wt Readings from Last 3 Encounters:  10/24/24 190 lb 6.4 oz (86.4 kg)  03/06/24 201 lb (91.2 kg)  12/14/23 204 lb (92.5 kg)   SpO2 Readings from Last 3 Encounters:  10/24/24 99%  10/25/23 99%  10/21/22 99%      Physical Exam Vitals and nursing note reviewed.  Constitutional:      Appearance: Normal appearance.  HENT:     Right Ear: Ear canal and external ear normal. There is impacted cerumen.     Left Ear: Tympanic membrane, ear canal and external ear normal.     Mouth/Throat:     Mouth: Mucous membranes are moist.     Pharynx: Oropharynx is clear.  Eyes:     Extraocular Movements: Extraocular movements intact.     Pupils: Pupils are equal, round, and reactive to light.  Cardiovascular:     Rate and Rhythm: Normal rate and regular rhythm.     Pulses: Normal pulses.     Heart sounds: Normal heart sounds.  Pulmonary:     Effort: Pulmonary effort is normal.     Breath sounds: Normal breath sounds.  Abdominal:     General: Bowel sounds are  normal. There is no distension.     Palpations: There is no mass.     Tenderness: There is no abdominal tenderness.     Hernia: No hernia is present.  Musculoskeletal:     Right lower leg: No edema.     Left lower leg: No edema.  Lymphadenopathy:     Cervical: No cervical adenopathy.  Skin:    General: Skin is warm.  Neurological:     General: No focal deficit present.     Mental Status: She is alert.     Deep Tendon Reflexes:     Reflex Scores:      Bicep reflexes are 2+ on the right side and 2+ on the left side.      Patellar reflexes are 2+ on the right side and 2+ on the left side.    Comments: Bilateral upper and lower extremity strength 5/5  Psychiatric:        Mood and Affect: Mood normal.        Behavior: Behavior normal.        Thought Content:  Thought content normal.        Judgment: Judgment normal.      No results found for any visits on 10/24/24.    The 10-year ASCVD risk score (Arnett DK, et al., 2019) is: 4.1%    Assessment & Plan:   Problem List Items Addressed This Visit       Cardiovascular and Mediastinum   Primary hypertension   Patient currently maintained on lisinopril  5 mg daily.  Does not check blood pressure at home.  Blood pressure controlled.  Continue lisinopril  5 mg daily      Relevant Orders   Comprehensive metabolic panel with GFR   CBC with Differential/Platelet   Hemoglobin A1c   TSH   Lipid panel     Other   Need for influenza vaccination   Relevant Orders   Flu vaccine trivalent PF, 6mos and older(Flulaval,Afluria,Fluarix,Fluzone) (Completed)   Preventative health care - Primary   Discussed age-appropriate immunizations and screening exams.  Did review patient's personal, surgical, social, family histories.  Patient is up-to-date with all age-appropriate vaccinations he would like.  Did discuss shingles vaccine in office.  Administer flu and Prevnar 20 today.  Did discuss Cologuard and colonoscopy for CRC screening.  Patient declined both at this juncture.  Mammogram placed for breast cancer screening.  Bone density scan placed for osteoporosis screening.  Patient was given information at discharge to call and set up at her convenience.  Patient was given information at discharge about preventative healthcare maintenance with anticipatory guidance.      Relevant Orders   Comprehensive metabolic panel with GFR   CBC with Differential/Platelet   TSH   Obesity (BMI 30-39.9)   History of the same pending A1c, lipid panel, A1c.  Patient has lost weight since last year continue working on healthy lifestyle modifications she is down and that of at least 14 pounds      Elevated LDL cholesterol level   History of the same pending lipid panel today      Relevant Orders   Hemoglobin A1c    Lipid panel   Vitamin D  deficiency   History of same pending vitamin D  level today      Relevant Orders   VITAMIN D  25 Hydroxy (Vit-D Deficiency, Fractures)   Prediabetes   History of same pending A1c today      Relevant Orders   Hemoglobin A1c  Lipid panel   Other Visit Diagnoses       Need for pneumococcal 20-valent conjugate vaccination       Relevant Orders   Pneumococcal conjugate vaccine 20-valent (Prevnar 20) (Completed)     Screening mammogram for breast cancer       Relevant Orders   MM 3D SCREENING MAMMOGRAM BILATERAL BREAST       Return in about 1 year (around 10/24/2025).    Adina Crandall, NP  "

## 2024-10-24 NOTE — Assessment & Plan Note (Signed)
 Discussed age-appropriate immunizations and screening exams.  Did review patient's personal, surgical, social, family histories.  Patient is up-to-date with all age-appropriate vaccinations he would like.  Did discuss shingles vaccine in office.  Administer flu and Prevnar 20 today.  Did discuss Cologuard and colonoscopy for CRC screening.  Patient declined both at this juncture.  Mammogram placed for breast cancer screening.  Bone density scan placed for osteoporosis screening.  Patient was given information at discharge to call and set up at her convenience.  Patient was given information at discharge about preventative healthcare maintenance with anticipatory guidance.

## 2024-10-24 NOTE — Assessment & Plan Note (Signed)
History of same pending A1c today

## 2024-10-24 NOTE — Assessment & Plan Note (Signed)
 History of the same pending lipid panel today

## 2024-10-24 NOTE — Assessment & Plan Note (Signed)
 Patient currently maintained on lisinopril  5 mg daily.  Does not check blood pressure at home.  Blood pressure controlled.  Continue lisinopril  5 mg daily

## 2024-10-27 ENCOUNTER — Ambulatory Visit: Payer: Self-pay | Admitting: Nurse Practitioner

## 2024-10-27 DIAGNOSIS — E559 Vitamin D deficiency, unspecified: Secondary | ICD-10-CM

## 2024-10-27 MED ORDER — VITAMIN D (ERGOCALCIFEROL) 1.25 MG (50000 UNIT) PO CAPS: 50000.0000 [IU] | ORAL_CAPSULE | ORAL | 0 refills | Status: AC
